# Patient Record
Sex: Male | Born: 1975
Health system: Southern US, Community
[De-identification: ages and names within clinical notes are randomized; demographics above are authoritative.]

## PROBLEM LIST (undated history)

## (undated) DIAGNOSIS — R011 Cardiac murmur, unspecified: Secondary | ICD-10-CM

## (undated) HISTORY — PX: FRACTURE SURGERY: SHX138

## (undated) HISTORY — DX: Cardiac murmur, unspecified: R01.1

---

## 2010-12-03 DIAGNOSIS — Q249 Congenital malformation of heart, unspecified: Secondary | ICD-10-CM | POA: Insufficient documentation

## 2015-12-24 DIAGNOSIS — M533 Sacrococcygeal disorders, not elsewhere classified: Secondary | ICD-10-CM | POA: Diagnosis not present

## 2016-01-22 DIAGNOSIS — J069 Acute upper respiratory infection, unspecified: Secondary | ICD-10-CM | POA: Diagnosis not present

## 2016-01-22 DIAGNOSIS — B9789 Other viral agents as the cause of diseases classified elsewhere: Secondary | ICD-10-CM | POA: Diagnosis not present

## 2016-01-22 DIAGNOSIS — R030 Elevated blood-pressure reading, without diagnosis of hypertension: Secondary | ICD-10-CM | POA: Diagnosis not present

## 2016-01-27 DIAGNOSIS — J069 Acute upper respiratory infection, unspecified: Secondary | ICD-10-CM | POA: Diagnosis not present

## 2016-01-27 DIAGNOSIS — J3089 Other allergic rhinitis: Secondary | ICD-10-CM | POA: Diagnosis not present

## 2016-01-27 DIAGNOSIS — R05 Cough: Secondary | ICD-10-CM | POA: Diagnosis not present

## 2016-02-24 DIAGNOSIS — J069 Acute upper respiratory infection, unspecified: Secondary | ICD-10-CM | POA: Diagnosis not present

## 2016-02-24 DIAGNOSIS — B9789 Other viral agents as the cause of diseases classified elsewhere: Secondary | ICD-10-CM | POA: Diagnosis not present

## 2016-03-03 DIAGNOSIS — H6693 Otitis media, unspecified, bilateral: Secondary | ICD-10-CM | POA: Diagnosis not present

## 2016-03-03 DIAGNOSIS — R05 Cough: Secondary | ICD-10-CM | POA: Diagnosis not present

## 2016-03-03 DIAGNOSIS — R0981 Nasal congestion: Secondary | ICD-10-CM | POA: Diagnosis not present

## 2016-03-24 DIAGNOSIS — Z8739 Personal history of other diseases of the musculoskeletal system and connective tissue: Secondary | ICD-10-CM | POA: Diagnosis not present

## 2016-03-24 DIAGNOSIS — M545 Low back pain: Secondary | ICD-10-CM | POA: Diagnosis not present

## 2016-04-13 DIAGNOSIS — M545 Low back pain: Secondary | ICD-10-CM | POA: Diagnosis not present

## 2016-04-13 DIAGNOSIS — G8929 Other chronic pain: Secondary | ICD-10-CM | POA: Diagnosis not present

## 2016-04-13 DIAGNOSIS — Z7689 Persons encountering health services in other specified circumstances: Secondary | ICD-10-CM | POA: Diagnosis not present

## 2016-04-20 DIAGNOSIS — H1033 Unspecified acute conjunctivitis, bilateral: Secondary | ICD-10-CM | POA: Diagnosis not present

## 2016-04-20 DIAGNOSIS — J028 Acute pharyngitis due to other specified organisms: Secondary | ICD-10-CM | POA: Diagnosis not present

## 2016-04-20 DIAGNOSIS — J029 Acute pharyngitis, unspecified: Secondary | ICD-10-CM | POA: Diagnosis not present

## 2017-03-17 DIAGNOSIS — J0100 Acute maxillary sinusitis, unspecified: Secondary | ICD-10-CM | POA: Diagnosis not present

## 2017-04-15 DIAGNOSIS — M545 Low back pain: Secondary | ICD-10-CM | POA: Diagnosis not present

## 2017-05-04 DIAGNOSIS — G8929 Other chronic pain: Secondary | ICD-10-CM | POA: Diagnosis not present

## 2017-05-04 DIAGNOSIS — M545 Low back pain: Secondary | ICD-10-CM | POA: Diagnosis not present

## 2017-05-20 DIAGNOSIS — S0990XA Unspecified injury of head, initial encounter: Secondary | ICD-10-CM | POA: Diagnosis not present

## 2017-05-20 DIAGNOSIS — S0180XA Unspecified open wound of other part of head, initial encounter: Secondary | ICD-10-CM | POA: Diagnosis not present

## 2017-05-22 DIAGNOSIS — R000 Tachycardia, unspecified: Secondary | ICD-10-CM | POA: Diagnosis not present

## 2017-05-22 DIAGNOSIS — W228XXA Striking against or struck by other objects, initial encounter: Secondary | ICD-10-CM | POA: Diagnosis not present

## 2017-05-22 DIAGNOSIS — H5789 Other specified disorders of eye and adnexa: Secondary | ICD-10-CM | POA: Diagnosis not present

## 2017-05-22 DIAGNOSIS — S0990XA Unspecified injury of head, initial encounter: Secondary | ICD-10-CM | POA: Diagnosis not present

## 2017-11-05 ENCOUNTER — Other Ambulatory Visit: Payer: Self-pay

## 2017-11-05 ENCOUNTER — Emergency Department: Payer: BLUE CROSS/BLUE SHIELD

## 2017-11-05 ENCOUNTER — Telehealth: Payer: Self-pay | Admitting: Emergency Medicine

## 2017-11-05 ENCOUNTER — Encounter: Payer: Self-pay | Admitting: Emergency Medicine

## 2017-11-05 ENCOUNTER — Emergency Department (INDEPENDENT_AMBULATORY_CARE_PROVIDER_SITE_OTHER)
Admission: EM | Admit: 2017-11-05 | Discharge: 2017-11-05 | Disposition: A | Discharge status: Home / Self Care | Payer: BLUE CROSS/BLUE SHIELD | Source: Home / Self Care | Attending: Family Medicine | Admitting: Family Medicine

## 2017-11-05 DIAGNOSIS — M79605 Pain in left leg: Secondary | ICD-10-CM | POA: Diagnosis not present

## 2017-11-05 DIAGNOSIS — I8002 Phlebitis and thrombophlebitis of superficial vessels of left lower extremity: Secondary | ICD-10-CM | POA: Diagnosis not present

## 2017-11-05 LAB — POCT CBC W AUTO DIFF (K'VILLE URGENT CARE)

## 2017-11-05 LAB — BASIC METABOLIC PANEL
BUN: 9 mg/dL (ref 7–25)
CO2: 24 mmol/L (ref 20–32)
Calcium: 9 mg/dL (ref 8.6–10.3)
Chloride: 108 mmol/L (ref 98–110)
Creat: 0.76 mg/dL (ref 0.60–1.35)
Glucose, Bld: 77 mg/dL (ref 65–99)
Potassium: 4.1 mmol/L (ref 3.5–5.3)
Sodium: 140 mmol/L (ref 135–146)

## 2017-11-05 MED ORDER — RIVAROXABAN 10 MG PO TABS: 10.0000 mg | ORAL_TABLET | Freq: Every day | ORAL | 0 refills | Status: DC

## 2017-11-05 MED ORDER — ELIQUIS 5 MG VTE STARTER PACK: ORAL_TABLET | ORAL | 0 refills | Status: DC

## 2017-11-05 MED ORDER — APIXABAN (ELIQUIS) EDUCATION KIT FOR DVT/PE PATIENTS: 1.0000 | PACK | Freq: Once | 0 refills | Status: AC

## 2017-11-05 NOTE — ED Triage Notes (Signed)
Left leg pain x 3 days, swollen at ankle, bruises on upper leg, doesn't know how they got there.

## 2017-11-05 NOTE — Discharge Instructions (Signed)
°  You were found to have a very large (from your mid calf to upper thigh) blood clot in your superficial veins.  Because of the size of the clot, you will be started on a blood thinning medication called rivaroxaban (Xarelto).  It is very important to establish care with a primary care provider/family medicine doctor as soon as possible for ongoing care and monitoring of this clot.  It is highly recommended that you start using compression socks or stockings to help with the pain and swelling, this will help the clot resolve faster. Please call 911 or go to the hospital if symptoms continue to worsen- increased pain or leg swelling, or if you develop chest pain or shortness of breath. While rare, a clot can break off and go to your heart or lungs, causing difficulty breathing and even death.

## 2017-11-05 NOTE — Telephone Encounter (Signed)
Pt returned with concern for cost of Xarelto. Will change pt to Eliquis with a 30 day Free trial kit. reiterated importance of establishing care with a PCP for ongoing care and monitoring of his condition.

## 2017-11-05 NOTE — ED Provider Notes (Signed)
[facility]    CSN: [id number] Arrival date & time: 11/05/17  1751     History   Chief Complaint Chief Complaint  Patient presents with  . Leg Pain    HPI Travis Gay is a 42 y.o. male.   HPI Travis Gay is a 42 y.o. male presenting to UC with c/o 3 days of gradually worsening Left upper medial leg mild soreness with redness and a small bruise but no known injury. He also reports swelling of his Left ankle. Hx of varicose veins since he was 42yo. He does participate in mixed martial arts and stands for prolonged periods of time but no known injury. No hx of blood clots. No recent travel or surgery. Denies chest pain or SOB.   History reviewed. No pertinent past medical history.  There are no active problems to display for this patient.   History reviewed. No pertinent surgical history.     Home Medications    Prior to Admission medications   Medication Sig Start Date End Date Taking? Authorizing Provider  rivaroxaban (XARELTO) 10 MG TABS tablet Take 1 tablet (10 mg total) by mouth daily. 11/05/17   Noe Gens, PA-C    Family History History reviewed. No pertinent family history.  Social History Social History   Tobacco Use  . Smoking status: Never Smoker  . Smokeless tobacco: Never Used  Substance Use Topics  . Alcohol use: Yes  . Drug use: Never     Allergies   Patient has no allergy information on record.   Review of Systems Review of Systems  Constitutional: Negative for chills and fever.  Respiratory: Negative for chest tightness and shortness of breath.   Cardiovascular: Positive for leg swelling. Negative for chest pain and palpitations.  Musculoskeletal: Positive for joint swelling and myalgias. Negative for arthralgias, back pain and gait problem.  Skin: Positive for color change. Negative for wound.  Neurological: Negative for weakness and numbness.     Physical Exam Triage Vital Signs ED Triage Vitals [11/05/17 0843]    Enc Vitals Group     BP 135/89     Pulse Rate 68     Resp      Temp 98.4 F (36.9 C)     Temp Source Oral     SpO2 100 %     Weight      Height      Head Circumference      Peak Flow      Pain Score      Pain Loc      Pain Edu?      Excl. in [place]?    No data found.  Updated Vital Signs BP 135/89 (BP Location: Right Arm)   Pulse 68   Temp 98.4 F (36.9 C) (Oral)   Ht 5\' 11"  (1.803 m)   Wt 200 lb (90.7 kg)   SpO2 100%   BMI 27.89 kg/m   Visual Acuity Right Eye Distance:   Left Eye Distance:   Bilateral Distance:    Right Eye Near:   Left Eye Near:    Bilateral Near:     Physical Exam  Constitutional: He is oriented to person, place, and time. He appears well-developed and well-nourished.  HENT:  Head: Normocephalic and atraumatic.  Eyes: EOM are normal.  Neck: Normal range of motion.  Cardiovascular: Normal rate.  Pulmonary/Chest: Effort normal.  Musculoskeletal: Normal range of motion. He exhibits edema and tenderness.  Bilateral leg edema, Left worse than Right.  Muscle compartments are soft. Tenderness to Left medial thigh.   Neurological: He is alert and oriented to person, place, and time.  Skin: Skin is warm and dry. There is erythema.     Psychiatric: He has a normal mood and affect. His behavior is normal.  Nursing note and vitals reviewed.    UC Treatments / Results  Labs (all labs ordered are listed, but only abnormal results are displayed) Labs Reviewed  BASIC METABOLIC PANEL  POCT CBC W AUTO DIFF ([place])    EKG None  Radiology US Venous Img Lower Unilateral Left  Result Date: 11/05/2017 CLINICAL DATA:  Upper medial thigh pain x4 days, redness and warmth EXAM: LEFT LOWER EXTREMITY VENOUS DOPPLER ULTRASOUND TECHNIQUE: Gray-scale sonography with compression, as well as color and duplex ultrasound, were performed to evaluate the deep venous system from the level of the common femoral vein through the popliteal and proximal  calf veins. COMPARISON:  None FINDINGS: Normal compressibility of the common femoral, superficial femoral, and popliteal veins, as well as the proximal calf veins. No filling defects to suggest DVT on grayscale or color Doppler imaging. Doppler waveforms show normal direction of venous flow, normal respiratory phasicity and response to augmentation. Noncompressibility of the great saphenous vein extending from mid calf across the knee to the upper thigh. Survey views of the contralateral common femoral vein are unremarkable. IMPRESSION: 1. Superficial thrombophlebitis involving left great saphenous vein. 2.  No evidence of left lower extremity deep vein thrombosis. Electronically Signed   By: Lucrezia Europe M.D.   On: 11/05/2017 09:41    Procedures Procedures (including critical care time)  Medications Ordered in UC Medications - No data to display  Initial Impression / Assessment and Plan / UC Course  I have reviewed the triage vital signs and the nursing notes.  Pertinent labs & imaging results that were available during my care of the patient were reviewed by me and considered in my medical decision making (see chart for details).      Final Clinical Impressions(s) / UC Diagnoses   Final diagnoses:  Left leg pain  Superficial thrombophlebitis of left leg     Discharge Instructions      You were found to have a very large (from your mid calf to upper thigh) blood clot in your superficial veins.  Because of the size of the clot, you will be started on a blood thinning medication called rivaroxaban (Xarelto).  It is very important to establish care with a primary care provider/family medicine doctor as soon as possible for ongoing care and monitoring of this clot.  It is highly recommended that you start using compression socks or stockings to help with the pain and swelling, this will help the clot resolve faster. Please call 911 or go to the hospital if symptoms continue to worsen-  increased pain or leg swelling, or if you develop chest pain or shortness of breath. While rare, a clot can break off and go to your heart or lungs, causing difficulty breathing and even death.     ED Prescriptions    Medication Sig Dispense Auth. Provider   rivaroxaban (XARELTO) 10 MG TABS tablet  (Status: Discontinued) Take 1 tablet (10 mg total) by mouth daily. 45 tablet Leeroy Cha O, PA-C   rivaroxaban (XARELTO) 10 MG TABS tablet Take 1 tablet (10 mg total) by mouth daily. 45 tablet Noe Gens, [place]     Controlled Substance Prescriptions [place] Controlled Substance Registry consulted? Not Applicable  Noe Gens, [place] 11/05/17 1115

## 2017-11-06 ENCOUNTER — Telehealth: Payer: Self-pay | Admitting: Emergency Medicine

## 2017-12-10 ENCOUNTER — Encounter: Payer: Self-pay | Admitting: Physician Assistant

## 2017-12-10 ENCOUNTER — Ambulatory Visit (INDEPENDENT_AMBULATORY_CARE_PROVIDER_SITE_OTHER): Payer: BLUE CROSS/BLUE SHIELD | Admitting: Physician Assistant

## 2017-12-10 VITALS — BP 129/89 | HR 74 | Resp 14 | Ht 71.0 in | Wt 202.0 lb

## 2017-12-10 DIAGNOSIS — I8393 Asymptomatic varicose veins of bilateral lower extremities: Secondary | ICD-10-CM

## 2017-12-10 DIAGNOSIS — Z008 Encounter for other general examination: Secondary | ICD-10-CM

## 2017-12-10 DIAGNOSIS — I8002 Phlebitis and thrombophlebitis of superficial vessels of left lower extremity: Secondary | ICD-10-CM | POA: Diagnosis not present

## 2017-12-10 DIAGNOSIS — Z13220 Encounter for screening for lipoid disorders: Secondary | ICD-10-CM

## 2017-12-10 DIAGNOSIS — E663 Overweight: Secondary | ICD-10-CM

## 2017-12-10 DIAGNOSIS — R609 Edema, unspecified: Secondary | ICD-10-CM | POA: Diagnosis not present

## 2017-12-10 DIAGNOSIS — Z7689 Persons encountering health services in other specified circumstances: Secondary | ICD-10-CM | POA: Diagnosis not present

## 2017-12-10 DIAGNOSIS — Z0189 Encounter for other specified special examinations: Secondary | ICD-10-CM

## 2017-12-10 DIAGNOSIS — I82511 Chronic embolism and thrombosis of right femoral vein: Secondary | ICD-10-CM | POA: Insufficient documentation

## 2017-12-10 MED ORDER — MEDICAL COMPRESSION SOCKS MISC: 0 refills | Status: DC

## 2017-12-10 MED ORDER — APIXABAN 5 MG PO TABS: 5.0000 mg | ORAL_TABLET | Freq: Two times a day (BID) | ORAL | 1 refills | Status: DC

## 2017-12-10 NOTE — Patient Instructions (Signed)
For your blood pressure: - Goal <130/80 - monitor and log blood pressures at home - check around the same time each day in a relaxed setting - Limit salt to <2000 mg/day - Follow DASH eating plan - limit alcohol to 2 standard drinks per day for men and 1 per day for women - avoid tobacco products - weight loss: 7% of current body weight - follow-up every 6 months for your blood pressure    Deep Vein Thrombosis Deep vein thrombosis (DVT) is a condition in which a blood clot forms in a deep vein, such as a lower leg, thigh, or arm vein. A clot is blood that has thickened into a gel or solid. This condition is dangerous. It can lead to serious and even life-threatening complications if the clot travels to the lungs and causes a blockage (pulmonary embolism). It can also damage veins in the leg. This can result in leg pain, swelling, discoloration, and sores (post-thrombotic syndrome). What are the causes? This condition may be caused by:  A slowdown of blood flow.  Damage to a vein.  A condition that makes blood clot more easily.  What increases the risk? The following factors may make you more likely to develop this condition:  Being overweight.  Being elderly, especially over age 33.  Sitting or lying down for more than four hours.  Lack of physical activity (sedentary lifestyle).  Being pregnant, giving birth, or having recently given birth.  Taking medicines that contain estrogen.  Smoking.  A history of any of the following: ? Blood clots or blood clotting disease. ? Peripheral vascular disease. ? Inflammatory bowel disease. ? Cancer. ? Heart disease. ? Genetic conditions that affect how blood clots. ? Neurological diseases that affect the legs (leg paresis). ? Injury. ? Major or lengthy surgery. ? A central line placed inside a large vein.  What are the signs or symptoms? Symptoms of this condition include:  Swelling, pain, or tenderness in an arm or  leg.  Warmth, redness, or discoloration in an arm or leg.  If the clot is in your leg, symptoms may be more noticeable or worse when you stand or walk. Some people do not have any symptoms. How is this diagnosed? This condition is diagnosed with:  A medical history.  A physical exam.  Tests, such as: ? Blood tests. These are done to see how your blood clots. ? Imaging tests. These are done to check for clots. Tests may include:  Ultrasound.  CT scan.  MRI.  X-ray.  Venogram. For this test, X-rays are taken after a dye is injected into a vein.  How is this treated? Treatment for this condition depends on the cause, your risk for bleeding or developing more clots, and any medical conditions you have. Treatment may include:  Taking blood thinners (also called anticoagulants). These medicines may be taken by mouth, injected under the skin, or injected through an IV tube (catheter). These medicines prevent clots from forming.  Injecting medicine that dissolves blood clots into the affected vein (catheter-directed thrombolysis).  Having surgery. Surgery may be done to: ? Remove the clot. ? Place a filter in a large vein to catch blood clots before they reach the lungs.  Some treatments may be continued for up to six months. Follow these instructions at home: If you are taking an oral blood thinner:  Take the medicine exactly as told by your health care provider. Some blood thinners need to be taken at the same time  every day. Do not skip a dose.  Ask your health care provider about what foods and drugs interact with the medicine.  Ask about possible side effects. General instructions  Blood thinners can cause easy bruising and difficulty stopping bleeding. Because of this, if you are taking or were given a blood thinner: ? Hold pressure over cuts for longer than usual. ? Tell your dentist and other health care providers that you are taking blood thinners before having any  procedures that can cause bleeding. ? Avoid contact sports.  Take over-the-counter and prescription medicines only as told by your health care provider.  Return to your normal activities as told by your health care provider. Ask your health care provider what activities are safe for you.  Wear compression stockings if recommended by your health care provider.  Keep all follow-up visits as told by your health care provider. This is important. How is this prevented? To lower your risk of developing this condition again:  For 30 or more minutes every day, do an activity that: ? Involves moving your arms and legs. ? Increases your heart rate.  When traveling for longer than four hours: ? Exercise your arms and legs every hour. ? Drink plenty of water. ? Avoid drinking alcohol.  Avoid sitting or lying for a long time without moving your legs.  Stay a healthy weight.  If you are a woman who is older than age 1, avoid unnecessary use of medicines that contain estrogen.  Do not use any products that contain nicotine or tobacco, such as cigarettes and e-cigarettes. This is especially important if you take estrogen medicines. If you need help quitting, ask your health care provider.  Contact a health care provider if:  You miss a dose of your blood thinner.  You have nausea, vomiting, or diarrhea that lasts for more than one day.  Your menstrual period is heavier than usual.  You have unusual bruising. Get help right away if:  You have new or increased pain, swelling, or redness in an arm or leg.  You have numbness or tingling in an arm or leg.  You have shortness of breath.  You have chest pain.  You have a rapid or irregular heartbeat.  You feel light-headed or dizzy.  You cough up blood.  There is blood in your vomit, stool, or urine.  You have a serious fall or accident, or you hit your head.  You have a severe headache or confusion.  You have a cut that will  not stop bleeding. These symptoms may represent a serious problem that is an emergency. Do not wait to see if the symptoms will go away. Get medical help right away. Call your local emergency services (911 in the [place] Do not drive yourself to the hospital. Summary  DVT is a condition in which a blood clot forms in a deep vein, such as a lower leg, thigh, or arm vein.  Symptoms can include swelling, warmth, pain, and redness in your leg or arm.  Treatment may include taking blood thinners, injecting medicine that dissolves blood clots,wearing compression stockings, or surgery.  If you are prescribed blood thinners, take them exactly as told. This information is not intended to replace advice given to you by your health care provider. Make sure you discuss any questions you have with your health care provider. Document Released: 02/02/2005 Document Revised: 03/07/2016 Document Reviewed: 03/07/2016 Elsevier Interactive Patient Education  2018 [employer].

## 2017-12-10 NOTE — Progress Notes (Signed)
HPI:                                                                Travis Gay is a 42 y.o. male who presents to [facility] today to establish care  Current concerns: requesting biometric exam for employer  Recently diagnosed on 11-05-17 with a superficial thrombophlebitis of the left lower extremity. Since there was involvement of the saphenofemoral junction, he was started on NOAC. He has been taking Eliquis since that time.  Appears this clot was unprovoked.  He denied any known injury or trauma, any prolonged immobilization, prior history of blood clots, or smoking. He reports his mother may have a history of blood clots but he is not certain. He endorses chronic bilateral lower extremity swelling.  He states he has not had any pain in his left leg for at least several weeks.  He is not currently wearing compression socks.   Depression screen PHQ 2/9 12/10/2017  Decreased Interest 0  Down, Depressed, Hopeless 0  PHQ - 2 Score 0    No flowsheet data found.    Past Medical History:  Diagnosis Date  . Heart murmur    Past Surgical History:  Procedure Laterality Date  . FRACTURE SURGERY Left    Social History   Tobacco Use  . Smoking status: Never Smoker  . Smokeless tobacco: Never Used  Substance Use Topics  . Alcohol use: Yes    Alcohol/week: 2.0 standard drinks    Types: 2 Standard drinks or equivalent per week   family history includes Cancer in his father; Deep vein thrombosis in his mother; Heart disease in his mother; Stroke in his mother.    ROS: negative except as noted in the HPI  Medications: Current Outpatient Medications  Medication Sig Dispense Refill  . apixaban (ELIQUIS) 5 MG TABS tablet Take 1 tablet (5 mg total) by mouth 2 (two) times daily. 60 tablet 1  . Elastic Bandages & Supports (MEDICAL COMPRESSION SOCKS) MISC Knee high, medium compression socks. Apply to both lower extremities daily. Remove  at bedtime 2 each 0   No current facility-administered medications for this visit.    No Known Allergies     Objective:  BP 129/89   Pulse 74   Resp 14   Ht 5\' 11"  (1.803 m)   Wt 202 lb (91.6 kg)   BMI 28.17 kg/m  Gen:  alert, not ill-appearing, no distress, appropriate for age 84: head normocephalic without obvious abnormality, conjunctiva and cornea clear, trachea midline Pulm: Normal work of breathing, normal phonation, clear to auscultation bilaterally, no wheezes, rales or rhonchi CV: Normal rate, regular rhythm, s1 and s2 distinct, no murmurs, clicks or rubs  Neuro: alert and oriented x 3, no tremor MSK: extremities atraumatic, calves nontender, there is 2+ peripheral edema of the LLE, and trace peripheral edem aof the RLE, normal gait and station Skin: intact, nontender varicosities of bilateral distal lower extremities Psych: well-groomed, cooperative, good eye contact, euthymic mood, affect mood-congruent, speech is articulate, and thought processes clear and goal-directed  CLINICAL DATA:  Upper medial thigh pain x4 days, redness and warmth  EXAM: LEFT LOWER EXTREMITY VENOUS DOPPLER ULTRASOUND  TECHNIQUE: Gray-scale sonography with compression, as well as color and duplex  ultrasound, were performed to evaluate the deep venous system from the level of the common femoral vein through the popliteal and proximal calf veins.  COMPARISON:  None  FINDINGS: Normal compressibility of the common femoral, superficial femoral, and popliteal veins, as well as the proximal calf veins. No filling defects to suggest DVT on grayscale or color Doppler imaging. Doppler waveforms show normal direction of venous flow, normal respiratory phasicity and response to augmentation. Noncompressibility of the great saphenous vein extending from mid calf across the knee to the upper thigh. Survey views of the contralateral common femoral vein are unremarkable.  IMPRESSION: 1.  Superficial thrombophlebitis involving left great saphenous vein. 2.  No evidence of left lower extremity deep vein thrombosis.   Electronically Signed   By: Lucrezia Europe M.D.   On: 11/05/2017 09:41  Lab Results  Component Value Date   CREATININE 0.76 11/05/2017   BUN 9 11/05/2017   NA 140 11/05/2017   K 4.1 11/05/2017   CL 108 11/05/2017   CO2 24 11/05/2017     Assessment and Plan: 42 y.o. male with   .Travis Gay was seen today for establish care.  Diagnoses and all orders for this visit:  Encounter to establish care  Thrombophlebitis of superficial veins of left lower extremity -     apixaban (ELIQUIS) 5 MG TABS tablet; Take 1 tablet (5 mg total) by mouth 2 (two) times daily. -     Elastic Bandages & Supports (MEDICAL COMPRESSION SOCKS) MISC; Knee high, medium compression socks. Apply to both lower extremities daily. Remove at bedtime  Peripheral edema -     Elastic Bandages & Supports (MEDICAL COMPRESSION SOCKS) MISC; Knee high, medium compression socks. Apply to both lower extremities daily. Remove at bedtime  Asymptomatic varicose veins of both lower extremities  Overweight (BMI 25.0-29.9)  Encounter for biometric screening -     Lipid Panel w/reflex Direct LDL  Screening for lipid disorders -     Lipid Panel w/reflex Direct LDL   - Personally reviewed PMH, PSH, PFH, medications, allergies, HM - Personally reviewed labs results from 10/2017 and [place] results - Age-appropriate cancer screening: n/a - Influenza declined - Tdap UTD - PHQ2 negative    Single unprovoked thrombophlebitis without true DVT. Recommend full 3 months of anticoagulation, so Eliquis was refilled for 2 more months today. Normal renal function. Compression socks also ordered to be worn daily.   Patient education and anticipatory guidance given Patient agrees with treatment plan Follow-up as needed if symptoms worsen or fail to improve  Darlyne Russian PA-C

## 2017-12-22 DIAGNOSIS — Z13220 Encounter for screening for lipoid disorders: Secondary | ICD-10-CM | POA: Diagnosis not present

## 2017-12-22 DIAGNOSIS — Z0189 Encounter for other specified special examinations: Secondary | ICD-10-CM | POA: Diagnosis not present

## 2017-12-23 LAB — LIPID PANEL W/REFLEX DIRECT LDL
Cholesterol: 169 mg/dL (ref <200)
HDL: 57 mg/dL (ref >40)
LDL CHOLESTEROL (CALC): 94 mg/dL
NON-HDL CHOLESTEROL (CALC): 112 mg/dL (ref <130)
Total CHOL/HDL Ratio: 3 (calc) (ref <5.0)
Triglycerides: 85 mg/dL (ref <150)

## 2018-02-28 ENCOUNTER — Other Ambulatory Visit: Payer: Self-pay | Admitting: Physician Assistant

## 2018-02-28 DIAGNOSIS — I8002 Phlebitis and thrombophlebitis of superficial vessels of left lower extremity: Secondary | ICD-10-CM

## 2018-07-20 ENCOUNTER — Ambulatory Visit (INDEPENDENT_AMBULATORY_CARE_PROVIDER_SITE_OTHER): Payer: BC Managed Care – PPO | Admitting: Physician Assistant

## 2018-07-20 ENCOUNTER — Encounter: Payer: Self-pay | Admitting: Physician Assistant

## 2018-07-20 VITALS — BP 131/78 | HR 89 | Temp 97.8°F | Ht 71.0 in | Wt 216.0 lb

## 2018-07-20 DIAGNOSIS — I8002 Phlebitis and thrombophlebitis of superficial vessels of left lower extremity: Secondary | ICD-10-CM

## 2018-07-20 DIAGNOSIS — R609 Edema, unspecified: Secondary | ICD-10-CM

## 2018-07-20 MED ORDER — IBUPROFEN 800 MG PO TABS: 800.0000 mg | ORAL_TABLET | Freq: Three times a day (TID) | ORAL | 0 refills | Status: DC | PRN

## 2018-07-20 NOTE — Patient Instructions (Signed)
Thrombophlebitis  Thrombophlebitis is a condition in which a blood clot forms in a vein. This can happen in your arms or legs, or in the area between your neck and groin (torso). When this condition happens in a vein that is close to the surface of the body (superficial thrombophlebitis), it is usually not serious.However, when the condition happens in a vein that is deep inside the body (deep vein thrombosis, DVT), it can cause serious problems.  What are the causes?  This condition may be caused by:   Damage to a vein.   Inflammation of the veins.   A condition that causes blood to clot more easily.   Reduced blood flow through the veins.  What increases the risk?  The following factors may make you more likely to develop this condition:   Having a condition that makes blood thicker or more likely to clot.   Having an infection.   Having major surgery.   Experiencing a traumatic injury or a broken bone.   Having a catheter in a vein (central line).   Having a condition in which valves in the veins do not work properly, causing blood to collect (pool) in the veins (chronic venous insufficiency).   An inactive (sedentary) lifestyle.   Pregnancy or having recently given birth.   Cancer.   Older age, especially being 60 or older.   Obesity.   Smoking.   Taking medicines that contain estrogen, such as birth control pills.   Having varicose veins.   Using drugs that are injected into the veins (intravenous, IV).  What are the signs or symptoms?  The main symptoms of this condition are:   Swelling and pain in an arm or leg. If the affected vein is in the leg, you may feel pain while standing or walking.   Warmth or redness in an arm or leg.  Other symptoms include:   Low-grade fever.   Muscle aches.   A bulging vein (venous distension).  In some cases, there are no symptoms.  How is this diagnosed?  This condition may be diagnosed based on:   Your symptoms and medical history.   A physical  exam.   Tests, such as:  ? Blood tests.  ? A test that uses sound waves to make images (ultrasound).  How is this treated?  Treatment depends on how severe the condition is and which area of the body is affected. Treatment may include:   Applying a warm compress or heating pad to affected areas.   Wearing compression stockings to help prevent blood clots and reduce swelling in your legs.   Raising (elevating) the affected arm or leg above the level of your heart.   Medicines, such as:  ? Anti-inflammatory medicines, such as ibuprofen.  ? Blood thinners (anticoagulants), such as heparin.  ? Antibiotic medicine, if you have an infection.   Removing an IV that may be causing the problem.  In rare cases, surgery may be needed to:   Remove a damaged section of a vein.   Place a filter in a large vein to catch blood clots before they reach the lungs.  Follow these instructions at home:  Medicines   Take over-the-counter and prescription medicines only as told by your health care provider.   If you were prescribed an antibiotic, take it as told by your health care provider. Do not stop using the antibiotic even if you feel better.  Managing pain, stiffness, and swelling       If directed, put heat on the affected area as often as told by your health care provider. Use the heat source that your health care provider recommends, such as a moist heat pack or a heating pad.  ? Place a towel between your skin and the heat source.  ? Leave the heat on for 20-30 minutes.  ? Remove the heat if your skin turns bright red. This is especially important if you are not able to feel pain, heat, or cold. You may have a greater risk of getting burned.   Elevate the affected area above the level of your heart while you are sitting or lying down.  Activity   Return to your normal activities as told by your health care provider. Ask your health care provider what activities are safe for you.   Avoid sitting or lying down for long  periods. If possible, stand up and walk around regularly.  If you are taking blood thinners:   Take your medicine exactly as told, at the same time every day.   Avoid activities that could cause injury or bruising, and follow instructions about how to prevent falls.   Wear a medical alert bracelet or carry a card that lists what medicines you take.  General instructions   Drink enough fluid to keep your urine pale yellow.   Wear compression stockings as told by your health care provider.   Do not use any products that contain nicotine or tobacco, such as cigarettes and e-cigarettes. If you need help quitting, ask your health care provider.   Keep all follow-up visits as told by your health care provider. This is important.  Contact a health care provider if:   You miss a dose of your blood thinner, if applicable.   Your symptoms do not improve.   You have unusual bruising.   You have nausea, vomiting, or diarrhea that lasts for more than one day.  Get help right away if:   You have any of these problems:  ? New or worse pain, swelling, or redness in an arm or leg.  ? Numbness or tingling in an arm or leg.  ? Shortness of breath.  ? Chest pain.  ? Severe pain in your abdomen.  ? Fast breathing.  ? A fast or irregular heartbeat.  ? Blood in your vomit, stool, or urine.  ? A severe headache or confusion.  ? A cut that does not stop bleeding.   You feel light-headed or dizzy.   You cough up blood.   You have a serious fall or accident, or you hit your head.  These symptoms may represent a serious problem that is an emergency. Do not wait to see if the symptoms will go away. Get medical help right away. Call your local emergency services (911 in the [place] Do not drive yourself to the hospital.  Summary   Thrombophlebitis is a condition in which a blood clot forms in a vein. This can happen in a vein close to the surface of the body or a vein deep inside the body.   This condition can cause serious  problems when it happens in a vein deep inside the body (deep vein thrombosis, DVT).   The main symptom of this condition is swelling and pain around the affected vein.   Treatment may include warm compresses, anti-inflammatory medicines, or blood thinners.  This information is not intended to replace advice given to you by your health care provider. Make sure you discuss   any questions you have with your health care provider.    Document Released: 07/29/2016 Document Revised: 07/29/2016 Document Reviewed: 07/29/2016  Elsevier Interactive Patient Education  2019 [employer]

## 2018-07-20 NOTE — Progress Notes (Signed)
   Subjective:    Patient ID: Travis Gay, male    DOB: December 27, 1975, 43 y.o.   MRN: [id number]  HPI  Pt is a 43 yo male with hx of superficial thrombophlebitis of left upper thigh in September 2019. He went to urgent care and was put on eliquis. He stayed on eliquis for 6 months. He stopped early febuary. He has had no problems since until a few days ago the pain/ache in his upper left leg started to come back. He denies any injury to this are. He does have prominent veins. He is on his feet a lot and even more lately. He denies any fever, chills, SOB. Left upper thigh does not feel warm but hit is tender to touch. Not tried anything to make better. Not wearing compression stockings. Never tried them. No recent surgeries or travel. No new medications.   .. Active Ambulatory Problems    Diagnosis Date Noted  . Congenital heart defect 12/03/2010  . Thrombophlebitis of superficial veins of left lower extremity 12/10/2017  . Peripheral edema 12/10/2017  . Asymptomatic varicose veins of both lower extremities 12/10/2017  . Overweight (BMI 25.0-29.9) 12/10/2017   Resolved Ambulatory Problems    Diagnosis Date Noted  . No Resolved Ambulatory Problems   Past Medical History:  Diagnosis Date  . Heart murmur     Review of Systems See HPI>     Objective:   Physical Exam Vitals signs reviewed.  Constitutional:      Appearance: Normal appearance.  HENT:     Head: Normocephalic.  Cardiovascular:     Rate and Rhythm: Normal rate and regular rhythm.     Pulses: Normal pulses.  Pulmonary:     Effort: Pulmonary effort is normal.     Breath sounds: Normal breath sounds.  Musculoskeletal:     Comments: 1 plus pitting edema left leg around ankle.  Tenderness and firmness of left great saphenous vein about 2-3cm of firmness. No warmth or redness noted.  Prominent vein. Negative homans sign.   Neurological:     General: No focal deficit present.     Mental Status: He is alert and oriented  to person, place, and time.  Psychiatric:        Mood and Affect: Mood normal.           Assessment & Plan:  Marland KitchenMarland KitchenJulis was seen today for leg pain.  Diagnoses and all orders for this visit:  Thrombophlebitis of superficial veins of left lower extremity -     ibuprofen (ADVIL) 800 MG tablet; Take 1 tablet (800 mg total) by mouth every 8 (eight) hours as needed.  Peripheral edema  reviewed venous doppler from 10/2017.   Area of palpable firmness and tenderness over great saphenous vein. Suspect flare of SVT of left leg. Discussed starting with conservative treatment with ibuprofen 800mg  TID for 7-14 days WITH warm compresses and compression stockings and frequent elevation. Discussed pathology of this occurring. Reassured no signs of infection. Risk of DVT is low and with SVT hx. If he were to have worsening pain and swelling. Follow up for stat venous doppler. HO given. Follow up as needed.   Reviewed plan with supervising physician Dr. Beatrice Lecher who agreed with plan.   [name].Spent 30 minutes with patient and greater than 50 percent of visit spent counseling patient regarding treatment plan.

## 2018-07-20 NOTE — Progress Notes (Signed)
89 97.8 Oral

## 2018-07-22 ENCOUNTER — Encounter: Payer: Self-pay | Admitting: Physician Assistant

## 2018-12-02 ENCOUNTER — Ambulatory Visit (INDEPENDENT_AMBULATORY_CARE_PROVIDER_SITE_OTHER): Payer: BC Managed Care – PPO | Admitting: Physician Assistant

## 2018-12-02 ENCOUNTER — Other Ambulatory Visit: Payer: Self-pay

## 2018-12-02 ENCOUNTER — Encounter: Payer: Self-pay | Admitting: Physician Assistant

## 2018-12-02 VITALS — BP 124/90 | HR 84 | Ht 71.0 in | Wt 220.0 lb

## 2018-12-02 DIAGNOSIS — Z131 Encounter for screening for diabetes mellitus: Secondary | ICD-10-CM

## 2018-12-02 DIAGNOSIS — I878 Other specified disorders of veins: Secondary | ICD-10-CM

## 2018-12-02 DIAGNOSIS — Z0000 Encounter for general adult medical examination without abnormal findings: Secondary | ICD-10-CM

## 2018-12-02 DIAGNOSIS — Z6830 Body mass index (BMI) 30.0-30.9, adult: Secondary | ICD-10-CM

## 2018-12-02 DIAGNOSIS — Z13220 Encounter for screening for lipoid disorders: Secondary | ICD-10-CM | POA: Diagnosis not present

## 2018-12-02 DIAGNOSIS — E6609 Other obesity due to excess calories: Secondary | ICD-10-CM

## 2018-12-02 DIAGNOSIS — R600 Localized edema: Secondary | ICD-10-CM | POA: Insufficient documentation

## 2018-12-02 NOTE — Progress Notes (Signed)
Subjective:    Patient ID: Travis Gay, male    DOB: Aug 04, 1975, 43 y.o.   MRN: [id number]  HPI  Pt is a 43 yo male who presents to the clinic for annual wellness exam.   He has no concerns or complaints today.   He has some intermittent swelling of legs after working all day and hx of thrombophlebitis. No SOB, orthopnea, CP.   .. Active Ambulatory Problems    Diagnosis Date Noted  . Congenital heart defect 12/03/2010  . Thrombophlebitis of superficial veins of left lower extremity 12/10/2017  . Peripheral edema 12/10/2017  . Asymptomatic varicose veins of both lower extremities 12/10/2017  . Overweight (BMI 25.0-29.9) 12/10/2017  . Class 1 obesity due to excess calories without serious comorbidity with body mass index (BMI) of 30.0 to 30.9 in adult 12/02/2018   Resolved Ambulatory Problems    Diagnosis Date Noted  . No Resolved Ambulatory Problems   Past Medical History:  Diagnosis Date  . Heart murmur    .[name] Family History  Problem Relation Age of Onset  . Deep vein thrombosis Mother   . Heart disease Mother   . Stroke Mother   . Cancer Father    .[name] Social History   Socioeconomic History  . Marital status: Married    Spouse name: Not on file  . Number of children: Not on file  . Years of education: Not on file  . Highest education level: Not on file  Occupational History  . Not on file  Social Needs  . Financial resource strain: Not on file  . Food insecurity    Worry: Not on file    Inability: Not on file  . Transportation needs    Medical: Not on file    Non-medical: Not on file  Tobacco Use  . Smoking status: Never Smoker  . Smokeless tobacco: Never Used  Substance and Sexual Activity  . Alcohol use: Yes    Alcohol/week: 2.0 standard drinks    Types: 2 Standard drinks or equivalent per week  . Drug use: Never  . Sexual activity: Yes    Birth control/protection: Condom  Lifestyle  . Physical activity    Days per week: Not on file    Minutes  per session: Not on file  . Stress: Not on file  Relationships  . Social Herbalist on phone: Not on file    Gets together: Not on file    Attends religious service: Not on file    Active member of club or organization: Not on file    Attends meetings of clubs or organizations: Not on file    Relationship status: Not on file  . Intimate partner violence    Fear of current or ex partner: Not on file    Emotionally abused: Not on file    Physically abused: Not on file    Forced sexual activity: Not on file  Other Topics Concern  . Not on file  Social History Narrative  . Not on file      Review of Systems  Constitutional: Negative.   HENT: Negative.   Eyes: Negative.   Respiratory: Negative.   Cardiovascular: Negative.   Gastrointestinal: Negative.   Endocrine: Negative.   Genitourinary: Negative.   Musculoskeletal: Negative.   Neurological: Negative.   Hematological: Negative.   Psychiatric/Behavioral: Negative.   All other systems reviewed and are negative.      Objective:   Physical Exam BP 124/90  Pulse 84   Ht 5\' 11"  (1.803 m)   Wt 220 lb (99.8 kg)   SpO2 97%   BMI 30.68 kg/m   General Appearance:    Alert, cooperative, no distress, appears stated age  Head:    Normocephalic, without obvious abnormality, atraumatic  Eyes:    PERRL, conjunctiva/corneas clear, EOM's intact, fundi    benign, both eyes       Ears:    Normal TM's and external ear canals, both ears  Nose:   Nares normal, septum midline, mucosa normal, no drainage    or sinus tenderness  Throat:   Lips, mucosa, and tongue normal; teeth and gums normal  Neck:   Supple, symmetrical, trachea midline, no adenopathy;       thyroid:  No enlargement/tenderness/nodules; no carotid   bruit or JVD  Back:     Symmetric, no curvature, ROM normal, no CVA tenderness  Lungs:     Clear to auscultation bilaterally, respirations unlabored  Chest wall:    No tenderness or deformity  Heart:     Regular rate and rhythm, S1 and S2 normal, 2/6 SE murmur, rub   or gallop  Abdomen:     Soft, non-tender, bowel sounds active all four quadrants,    no masses, no organomegaly        Extremities:   Extremities normal, atraumatic, no cyanosis. 1+ pitting edema/varicose veins present bilateral leg.   Pulses:   2+ and symmetric all extremities  Skin:   Skin color, texture, turgor normal, no rashes or lesions  Lymph nodes:   Cervical, supraclavicular, and axillary nodes normal  Neurologic:   CNII-XII intact. Normal strength, sensation and reflexes      throughout     .[name] Depression screen [facility] 2/9 12/02/2018 12/10/2017  Decreased Interest 0 0  Down, Depressed, Hopeless 0 0  PHQ - 2 Score 0 0  Altered sleeping 0 -  Tired, decreased energy 0 -  Change in appetite 0 -  Feeling bad or failure about yourself  0 -  Trouble concentrating 0 -  Moving slowly or fidgety/restless 0 -  Suicidal thoughts 0 -  PHQ-9 Score 0 -  Difficult doing work/chores Not difficult at all -       Assessment & Plan:  Marland KitchenMarland KitchenWadsworth was seen today for annual exam.  Diagnoses and all orders for this visit:  Routine physical examination  Screening for diabetes mellitus -     COMPLETE METABOLIC PANEL WITH GFR  Screening for lipid disorders -     Lipid Panel w/reflex Direct LDL  Class 1 obesity due to excess calories without serious comorbidity with body mass index (BMI) of 30.0 to 30.9 in adult  Chronic venous stasis   .Marland KitchenStart a regular exercise program and make sure you are eating a healthy diet Try to eat 4 servings of dairy a day or take a calcium supplement (500mg  twice a day). Pt declined flu shot. Fasting labs ordered.   [name].Discussed low carb diet with 1500 calories and 80g of protein.  Exercising at least 150 minutes a week.  [employer] could be a Microbiologist.   Some minmal swelling during the day encouraged compression stockings. Sounds like venous stasis. Pt denies any SOb. Suggested echo  to reevaluate the heart function. Pt would like to hold off for now.

## 2018-12-02 NOTE — Patient Instructions (Signed)

## 2018-12-03 LAB — COMPLETE METABOLIC PANEL WITH GFR
AG Ratio: 1.7 (calc) (ref 1.0–2.5)
ALT: 32 U/L (ref 9–46)
AST: 43 U/L — ABNORMAL HIGH (ref 10–40)
Albumin: 3.9 g/dL (ref 3.6–5.1)
Alkaline phosphatase (APISO): 87 U/L (ref 36–130)
BUN: 11 mg/dL (ref 7–25)
CO2: 23 mmol/L (ref 20–32)
Calcium: 8.9 mg/dL (ref 8.6–10.3)
Chloride: 107 mmol/L (ref 98–110)
Creat: 1.01 mg/dL (ref 0.60–1.35)
GFR, Est African American: 105 mL/min/{1.73_m2} (ref >=60)
GFR, Est Non African American: 91 mL/min/{1.73_m2} (ref >=60)
Globulin: 2.3 g/dL (calc) (ref 1.9–3.7)
Glucose, Bld: 83 mg/dL (ref 65–99)
Potassium: 4.1 mmol/L (ref 3.5–5.3)
Sodium: 142 mmol/L (ref 135–146)
Total Bilirubin: 1.9 mg/dL — ABNORMAL HIGH (ref 0.2–1.2)
Total Protein: 6.2 g/dL (ref 6.1–8.1)

## 2018-12-03 LAB — LIPID PANEL W/REFLEX DIRECT LDL
Cholesterol: 184 mg/dL (ref <200)
HDL: 53 mg/dL (ref >=40)
LDL Cholesterol (Calc): 113 mg/dL (calc) — ABNORMAL HIGH
Non-HDL Cholesterol (Calc): 131 mg/dL (calc) — ABNORMAL HIGH (ref <130)
Total CHOL/HDL Ratio: 3.5 (calc) (ref <5.0)
Triglycerides: 82 mg/dL (ref <150)

## 2018-12-06 ENCOUNTER — Encounter: Payer: Self-pay | Admitting: Physician Assistant

## 2018-12-06 ENCOUNTER — Other Ambulatory Visit: Payer: Self-pay

## 2018-12-06 ENCOUNTER — Other Ambulatory Visit: Payer: Self-pay | Admitting: Physician Assistant

## 2018-12-06 DIAGNOSIS — R17 Unspecified jaundice: Secondary | ICD-10-CM | POA: Insufficient documentation

## 2018-12-06 DIAGNOSIS — R748 Abnormal levels of other serum enzymes: Secondary | ICD-10-CM | POA: Insufficient documentation

## 2018-12-06 NOTE — Progress Notes (Signed)
Call pt:  He had a work form too I believe.   Cholesterol looks good. Up a little from last year. Watch processed and fried fatty foods.  Kidney and sugar look good.  Your bilirubin is up a little bit along with one of your liver enzymes. Lets recheck in 2 weeks and see if trending up. Avoid tylenol and alcohol for the next 2 weeks.

## 2019-07-10 ENCOUNTER — Encounter: Payer: Self-pay | Admitting: Family Medicine

## 2019-07-10 ENCOUNTER — Other Ambulatory Visit: Payer: Self-pay

## 2019-07-10 ENCOUNTER — Ambulatory Visit (INDEPENDENT_AMBULATORY_CARE_PROVIDER_SITE_OTHER): Payer: BC Managed Care – PPO | Admitting: Family Medicine

## 2019-07-10 VITALS — BP 127/71 | HR 87 | Temp 97.9°F | Ht 70.87 in | Wt 204.6 lb

## 2019-07-10 DIAGNOSIS — R202 Paresthesia of skin: Secondary | ICD-10-CM | POA: Insufficient documentation

## 2019-07-10 LAB — COMPLETE METABOLIC PANEL WITH GFR
AG Ratio: 1.8 (calc) (ref 1.0–2.5)
ALT: 37 U/L (ref 9–46)
AST: 38 U/L (ref 10–40)
Albumin: 3.7 g/dL (ref 3.6–5.1)
Alkaline phosphatase (APISO): 77 U/L (ref 36–130)
BUN: 8 mg/dL (ref 7–25)
CO2: 25 mmol/L (ref 20–32)
Calcium: 9.1 mg/dL (ref 8.6–10.3)
Chloride: 109 mmol/L (ref 98–110)
Creat: 0.9 mg/dL (ref 0.60–1.35)
GFR, Est African American: 121 mL/min/{1.73_m2} (ref >=60)
GFR, Est Non African American: 104 mL/min/{1.73_m2} (ref >=60)
Globulin: 2.1 g/dL (calc) (ref 1.9–3.7)
Glucose, Bld: 91 mg/dL (ref 65–99)
Potassium: 4 mmol/L (ref 3.5–5.3)
Sodium: 141 mmol/L (ref 135–146)
Total Bilirubin: 1.4 mg/dL — ABNORMAL HIGH (ref 0.2–1.2)
Total Protein: 5.8 g/dL — ABNORMAL LOW (ref 6.1–8.1)

## 2019-07-10 LAB — CBC
HCT: 47.9 % (ref 38.5–50.0)
Hemoglobin: 16.7 g/dL (ref 13.2–17.1)
MCH: 34.4 pg — ABNORMAL HIGH (ref 27.0–33.0)
MCHC: 34.9 g/dL (ref 32.0–36.0)
MCV: 98.8 fL (ref 80.0–100.0)
MPV: 11.4 fL (ref 7.5–12.5)
Platelets: 160 10*3/uL (ref 140–400)
RBC: 4.85 10*6/uL (ref 4.20–5.80)
RDW: 13 % (ref 11.0–15.0)
WBC: 5 10*3/uL (ref 3.8–10.8)

## 2019-07-10 LAB — VITAMIN B12: Vitamin B-12: 554 pg/mL (ref 200–1100)

## 2019-07-10 NOTE — Progress Notes (Signed)
Travis Gay - 44 y.o. male MRN [id number]  Date of birth: 07-12-1975  Subjective Chief Complaint  Patient presents with  . Numbness    HPI Travis Gay is a 44 y.o. male here today with complaint of paresthesias in extremities.  Report that this is worse if sitting for prolonged periods of time.  Bilateral upper and lower extremities are affected.  Has not really noticed if standing or moving around.  Has had some occasional back pain.  He denies neck pain, headache,  fever, chills, weakness.   ROS:  A comprehensive ROS was completed and negative except as noted per HPI     No Known Allergies  Past Medical History:  Diagnosis Date  . Heart murmur     Past Surgical History:  Procedure Laterality Date  . FRACTURE SURGERY Left     Social History   Socioeconomic History  . Marital status: Married    Spouse name: Not on file  . Number of children: Not on file  . Years of education: Not on file  . Highest education level: Not on file  Occupational History  . Not on file  Tobacco Use  . Smoking status: Never Smoker  . Smokeless tobacco: Never Used  Substance and Sexual Activity  . Alcohol use: Yes    Alcohol/week: 2.0 standard drinks    Types: 2 Standard drinks or equivalent per week  . Drug use: Never  . Sexual activity: Yes    Birth control/protection: Condom  Other Topics Concern  . Not on file  Social History Narrative  . Not on file   Social Determinants of Health   Financial Resource Strain:   . Difficulty of Paying Living Expenses:   Food Insecurity:   . Worried About Charity fundraiser in the Last Year:   . Arboriculturist in the Last Year:   Transportation Needs:   . Film/video editor (Medical):   [name] Lack of Transportation (Non-Medical):   Physical Activity:   . Days of Exercise per Week:   . Minutes of Exercise per Session:   Stress:   . Feeling of Stress :   Social Connections:   . Frequency of Communication with Friends and Family:    . Frequency of Social Gatherings with Friends and Family:   . Attends Religious Services:   . Active Member of Clubs or Organizations:   . Attends Archivist Meetings:   [name] Marital Status:     Family History  Problem Relation Age of Onset  . Deep vein thrombosis Mother   . Heart disease Mother   . Stroke Mother   . Cancer Father     Health Maintenance  Topic Date Due  . HIV Screening  Never done  . COVID-19 Vaccine (1) 07/26/2019 (Originally 07/13/1987)  . TETANUS/TDAP  05/21/2027  . INFLUENZA VACCINE  Discontinued     ----------------------------------------------------------------------------------------------------------------------------------------------------------------------------------------------------------------- Physical Exam BP 127/71 (BP Location: Left Arm, Patient Position: Sitting, Cuff Size: Normal)   Pulse 87   Temp 97.9 F (36.6 C) (Temporal)   Ht 5' 10.87" (1.8 m)   Wt 204 lb 9.6 oz (92.8 kg)   SpO2 98%   BMI 28.64 kg/m   Physical Exam Constitutional:      Appearance: Normal appearance.  Eyes:     General: No scleral icterus. Cardiovascular:     Rate and Rhythm: Normal rate and regular rhythm.  Pulmonary:     Effort: Pulmonary effort is normal.  Breath sounds: Normal breath sounds.  Musculoskeletal:     Cervical back: Neck supple.  Skin:    General: Skin is warm and dry.  Neurological:     General: No focal deficit present.     Mental Status: He is alert and oriented to person, place, and time.     Cranial Nerves: No cranial nerve deficit.     Motor: No weakness.     Gait: Gait normal.     Deep Tendon Reflexes: Reflexes normal.  Psychiatric:        Mood and Affect: Mood normal.        Behavior: Behavior normal.      ------------------------------------------------------------------------------------------------------------------------------------------------------------------------------------------------------------------- Assessment and Plan  Paresthesias Orders Placed This Encounter  Procedures  . COMPLETE METABOLIC PANEL WITH GFR  . B12  . CBC  . NCV with EMG(electromyography)    Standing Status:   Future    Standing Expiration Date:   07/09/2020  EMG/NCV ordered.   No orders of the defined types were placed in this encounter.   No follow-ups on file.    This visit occurred during the SARS-CoV-2 public health emergency.  Safety protocols were in place, including screening questions prior to the visit, additional usage of staff PPE, and extensive cleaning of exam room while observing appropriate contact time as indicated for disinfecting solutions.

## 2019-07-10 NOTE — Assessment & Plan Note (Signed)
Orders Placed This Encounter  Procedures  . COMPLETE METABOLIC PANEL WITH GFR  . B12  . CBC  . NCV with EMG(electromyography)    Standing Status:   Future    Standing Expiration Date:   07/09/2020  EMG/NCV ordered.

## 2019-12-18 ENCOUNTER — Encounter: Payer: Self-pay | Admitting: Emergency Medicine

## 2019-12-18 ENCOUNTER — Emergency Department (INDEPENDENT_AMBULATORY_CARE_PROVIDER_SITE_OTHER)
Admission: EM | Admit: 2019-12-18 | Discharge: 2019-12-18 | Disposition: A | Discharge status: Home / Self Care | Payer: BC Managed Care – PPO | Source: Home / Self Care

## 2019-12-18 ENCOUNTER — Other Ambulatory Visit: Payer: Self-pay

## 2019-12-18 ENCOUNTER — Emergency Department (INDEPENDENT_AMBULATORY_CARE_PROVIDER_SITE_OTHER): Payer: BC Managed Care – PPO

## 2019-12-18 DIAGNOSIS — L03115 Cellulitis of right lower limb: Secondary | ICD-10-CM

## 2019-12-18 DIAGNOSIS — M79604 Pain in right leg: Secondary | ICD-10-CM | POA: Diagnosis not present

## 2019-12-18 DIAGNOSIS — I8001 Phlebitis and thrombophlebitis of superficial vessels of right lower extremity: Secondary | ICD-10-CM

## 2019-12-18 DIAGNOSIS — I808 Phlebitis and thrombophlebitis of other sites: Secondary | ICD-10-CM | POA: Diagnosis not present

## 2019-12-18 DIAGNOSIS — M25471 Effusion, right ankle: Secondary | ICD-10-CM

## 2019-12-18 DIAGNOSIS — M7989 Other specified soft tissue disorders: Secondary | ICD-10-CM | POA: Diagnosis not present

## 2019-12-18 MED ORDER — CEPHALEXIN 500 MG PO CAPS: 500.0000 mg | ORAL_CAPSULE | Freq: Three times a day (TID) | ORAL | 0 refills | Status: AC

## 2019-12-18 MED ORDER — IBUPROFEN 600 MG PO TABS: 600.0000 mg | ORAL_TABLET | Freq: Four times a day (QID) | ORAL | 0 refills | Status: DC | PRN

## 2019-12-18 NOTE — ED Triage Notes (Signed)
RT ankle pain, swollen, warm to the touch x 3 days, denies injury. HX blood clot

## 2019-12-18 NOTE — ED Provider Notes (Addendum)
[facility]    CSN: [id number] Arrival date & time: 12/18/19  1247      History   Chief Complaint Chief Complaint  Patient presents with  . Ankle Pain    HPI Travis Gay is a 44 y.o. male.   HPI [name] is a 44 y.o. male presenting to UC with c/o 3 days of worsening pain, swelling and redness to Right lower leg just above his ankle. Hx of SVT in Left great saphenous vein in 2019.  He was tx with a 6 month course of eliquis due to size of the clot.  He did well on the medication and symptoms resolved but then he had another flare of similar pain and swelling of Left leg last year.  He was treated at that time with ibuprofen and warm compresses.  He has not had any flare ups until now. Denies injury to his leg. He is not on aspirin. He does not wear compression socks. He is on his feet for long hours for work. He recently changed PCP office because of insurance. He was not able to get into his PCP today.   Past Medical History:  Diagnosis Date  . Heart murmur     Patient Active Problem List   Diagnosis Date Noted  . Paresthesias 07/10/2019  . Elevated bilirubin 12/06/2018  . Elevated liver enzymes 12/06/2018  . Class 1 obesity due to excess calories without serious comorbidity with body mass index (BMI) of 30.0 to 30.9 in adult 12/02/2018  . Chronic venous stasis 12/02/2018  . Thrombophlebitis of superficial veins of left lower extremity 12/10/2017  . Peripheral edema 12/10/2017  . Asymptomatic varicose veins of both lower extremities 12/10/2017  . Overweight (BMI 25.0-29.9) 12/10/2017  . Congenital heart defect 12/03/2010    Past Surgical History:  Procedure Laterality Date  . FRACTURE SURGERY Left        Home Medications    Prior to Admission medications   Medication Sig Start Date End Date Taking? Authorizing Provider  cephALEXin (KEFLEX) 500 MG capsule Take 1 capsule (500 mg total) by mouth 3 (three) times daily for 7 days. 12/18/19 12/25/19   Noe Gens, PA-C  ibuprofen (ADVIL) 600 MG tablet Take 1 tablet (600 mg total) by mouth every 6 (six) hours as needed. 12/18/19   Noe Gens, PA-C    Family History Family History  Problem Relation Age of Onset  . Deep vein thrombosis Mother   . Heart disease Mother   . Stroke Mother   . Cancer Father     Social History Social History   Tobacco Use  . Smoking status: Never Smoker  . Smokeless tobacco: Never Used  Vaping Use  . Vaping Use: Never used  Substance Use Topics  . Alcohol use: Yes    Alcohol/week: 2.0 standard drinks    Types: 2 Standard drinks or equivalent per week  . Drug use: Never     Allergies   Patient has no known allergies.   Review of Systems Review of Systems  Constitutional: Negative for chills and fever.  Respiratory: Negative for chest tightness and shortness of breath.   Cardiovascular: Positive for leg swelling (right lower). Negative for chest pain and palpitations.  Musculoskeletal: Positive for arthralgias and joint swelling.  Skin: Positive for color change. Negative for wound.     Physical Exam Triage Vital Signs ED Triage Vitals  Enc Vitals Group     BP 12/18/19 1326 (!) 137/93  Pulse Rate 12/18/19 1326 69     Resp --      Temp 12/18/19 1326 98 F (36.7 C)     Temp Source 12/18/19 1326 Oral     SpO2 12/18/19 1326 97 %     Weight 12/18/19 1327 200 lb (90.7 kg)     Height 12/18/19 1327 6' (1.829 m)     Head Circumference --      Peak Flow --      Pain Score 12/18/19 1327 3     Pain Loc --      Pain Edu? --      Excl. in [place]? --    No data found.  Updated Vital Signs BP (!) 137/93 (BP Location: Right Arm)   Pulse 69   Temp 98 F (36.7 C) (Oral)   Ht 6' (1.829 m)   Wt 200 lb (90.7 kg)   SpO2 97%   BMI 27.12 kg/m   Visual Acuity Right Eye Distance:   Left Eye Distance:   Bilateral Distance:    Right Eye Near:   Left Eye Near:    Bilateral Near:     Physical Exam Vitals and nursing note reviewed.   Constitutional:      Appearance: Normal appearance. He is well-developed.  HENT:     Head: Normocephalic and atraumatic.  Cardiovascular:     Rate and Rhythm: Normal rate and regular rhythm.     Pulses:          Dorsalis pedis pulses are 2+ on the right side.  Pulmonary:     Effort: Pulmonary effort is normal.  Musculoskeletal:        General: Swelling and tenderness present. Normal range of motion.     Cervical back: Normal range of motion.     Comments: Right lower leg: moderate edema, erythema, warmth tenderness above ankle. Full ROM knee and ankle. No bony tenderness.   Skin:    General: Skin is warm and dry.     Capillary Refill: Capillary refill takes less than 2 seconds.     Findings: Erythema present.       Neurological:     Mental Status: He is alert and oriented to person, place, and time.  Psychiatric:        Behavior: Behavior normal.      UC Treatments / Results  Labs (all labs ordered are listed, but only abnormal results are displayed) Labs Reviewed - No data to display  EKG   Radiology US Venous Img Lower Unilateral Right  Result Date: 12/18/2019 CLINICAL DATA:  Right lower extremity pain and swelling for 3 days EXAM: RIGHT LOWER EXTREMITY VENOUS DOPPLER ULTRASOUND TECHNIQUE: Gray-scale sonography with graded compression, as well as color Doppler and duplex ultrasound were performed to evaluate the lower extremity deep venous systems from the level of the common femoral vein and including the common femoral, femoral, profunda femoral, popliteal and calf veins including the posterior tibial, peroneal and gastrocnemius veins when visible. The superficial great saphenous vein was also interrogated. Spectral Doppler was utilized to evaluate flow at rest and with distal augmentation maneuvers in the common femoral, femoral and popliteal veins. COMPARISON:  None. FINDINGS: Contralateral Common Femoral Vein: Respiratory phasicity is normal and symmetric with the  symptomatic side. No evidence of thrombus. Normal compressibility. Common Femoral Vein: No evidence of thrombus. Normal compressibility, respiratory phasicity and response to augmentation. Saphenofemoral Junction: No evidence of thrombus. Normal compressibility and flow on color Doppler imaging. Profunda Femoral Vein: No  evidence of thrombus. Normal compressibility and flow on color Doppler imaging. Femoral Vein: No evidence of thrombus. Normal compressibility, respiratory phasicity and response to augmentation. Popliteal Vein: No evidence of thrombus. Normal compressibility, respiratory phasicity and response to augmentation. Calf Veins: No evidence of thrombus. Normal compressibility and flow on color Doppler imaging. Superficial Great Saphenous Vein: Right GSV is dilated in the lower leg extending to the ankle. This usually is secondary to venous insufficiency. Venous Reflux:  Not assessed Other Findings: Right ankle area of swelling and discomfort correlates with a superficial thrombosed underlying tortuous varicosity. IMPRESSION: Negative for right lower extremity DVT. Right ankle region subserous varicosity superficial thrombosis/thrombophlebitis. Electronically Signed   By: Jerilynn Mages.  Shick M.D.   On: 12/18/2019 14:41    Procedures Procedures (including critical care time)  Medications Ordered in UC Medications - No data to display  Initial Impression / Assessment and Plan / UC Course  I have reviewed the triage vital signs and the nursing notes.  Pertinent labs & imaging results that were available during my care of the patient were reviewed by me and considered in my medical decision making (see chart for details).     Discussed imaging with pt Will tx for superficial thrombosis with ibuprofen, encouraged use of warm compresses and elevation. Will cover for potential secondary skin infection, area of skin involved is covered by pt's work boots. Pt has a hx of recurrent thrombophlebitis in Left  upper leg (no symptoms there today)  Pt reports having a new patient appointment scheduled later this week with his PCP. Will hold off on labs today as he plans to get blood drawn as part of annual physical. Encouraged to make sure they recheck his LFTs, consider checking lipase.  Advised he may need referral to vein specialist for recurrent SVTs. AVS given  Final Clinical Impressions(s) / UC Diagnoses   Final diagnoses:  Pain and swelling of right ankle  Cellulitis of right ankle  Superficial phlebitis of leg, right     Discharge Instructions      Please take your medication as prescribed. Elevate your leg 2-3 times daily and apply a warm damp washcloths 2-3 times daily for 10-15 minutest at a time.  Call to schedule a follow up appointment with primary care later this week for recheck of symptoms.  If this keeps recurring, you may need a referral to a vein specialist.    Call 911 or have someone drive you to the hospital if symptoms significantly worsening- worsening pain, swelling, chest pain, shortness of breath, or other new concerning symptoms develop.      ED Prescriptions    Medication Sig Dispense Auth. Provider   cephALEXin (KEFLEX) 500 MG capsule Take 1 capsule (500 mg total) by mouth 3 (three) times daily for 7 days. 21 capsule Gerarda Fraction, [facility] O, PA-C   ibuprofen (ADVIL) 600 MG tablet Take 1 tablet (600 mg total) by mouth every 6 (six) hours as needed. 30 tablet Noe Gens, [place]     PDMP not reviewed this encounter.     Noe Gens, PA-C 12/18/19 1529

## 2019-12-18 NOTE — Discharge Instructions (Addendum)
  Please take your medication as prescribed. Elevate your leg 2-3 times daily and apply a warm damp washcloths 2-3 times daily for 10-15 minutest at a time.  Call to schedule a follow up appointment with primary care later this week for recheck of symptoms.  If this keeps recurring, you may need a referral to a vein specialist.    Call 911 or have someone drive you to the hospital if symptoms significantly worsening- worsening pain, swelling, chest pain, shortness of breath, or other new concerning symptoms develop.

## 2019-12-28 ENCOUNTER — Encounter: Payer: Self-pay | Admitting: Family Medicine

## 2019-12-28 ENCOUNTER — Ambulatory Visit (INDEPENDENT_AMBULATORY_CARE_PROVIDER_SITE_OTHER): Payer: BC Managed Care – PPO | Admitting: Family Medicine

## 2019-12-28 VITALS — BP 121/70 | HR 72 | Temp 98.1°F | Ht 69.29 in | Wt 213.1 lb

## 2019-12-28 DIAGNOSIS — Z13220 Encounter for screening for lipoid disorders: Secondary | ICD-10-CM

## 2019-12-28 DIAGNOSIS — Z0000 Encounter for general adult medical examination without abnormal findings: Secondary | ICD-10-CM

## 2019-12-28 NOTE — Patient Instructions (Signed)
Preventive Care 41-44 Years Old, Male Preventive care refers to lifestyle choices and visits with your health care provider that can promote health and wellness. This includes:  A yearly physical exam. This is also called an annual well check.  Regular dental and eye exams.  Immunizations.  Screening for certain conditions.  Healthy lifestyle choices, such as eating a healthy diet, getting regular exercise, not using drugs or products that contain nicotine and tobacco, and limiting alcohol use. What can I expect for my preventive care visit? Physical exam Your health care provider will check:  Height and weight. These may be used to calculate body mass index (BMI), which is a measurement that tells if you are at a healthy weight.  Heart rate and blood pressure.  Your skin for abnormal spots. Counseling Your health care provider may ask you questions about:  Alcohol, tobacco, and drug use.  Emotional well-being.  Home and relationship well-being.  Sexual activity.  Eating habits.  Work and work Statistician. What immunizations do I need?  Influenza (flu) vaccine  This is recommended every year. Tetanus, diphtheria, and pertussis (Tdap) vaccine  You may need a Td booster every 10 years. Varicella (chickenpox) vaccine  You may need this vaccine if you have not already been vaccinated. Zoster (shingles) vaccine  You may need this after age 64. Measles, mumps, and rubella (MMR) vaccine  You may need at least one dose of MMR if you were born in 1957 or later. You may also need a second dose. Pneumococcal conjugate (PCV13) vaccine  You may need this if you have certain conditions and were not previously vaccinated. Pneumococcal polysaccharide (PPSV23) vaccine  You may need one or two doses if you smoke cigarettes or if you have certain conditions. Meningococcal conjugate (MenACWY) vaccine  You may need this if you have certain conditions. Hepatitis A  vaccine  You may need this if you have certain conditions or if you travel or work in places where you may be exposed to hepatitis A. Hepatitis B vaccine  You may need this if you have certain conditions or if you travel or work in places where you may be exposed to hepatitis B. Haemophilus influenzae type b (Hib) vaccine  You may need this if you have certain risk factors. Human papillomavirus (HPV) vaccine  If recommended by your health care provider, you may need three doses over 6 months. You may receive vaccines as individual doses or as more than one vaccine together in one shot (combination vaccines). Talk with your health care provider about the risks and benefits of combination vaccines. What tests do I need? Blood tests  Lipid and cholesterol levels. These may be checked every 5 years, or more frequently if you are over 60 years old.  Hepatitis C test.  Hepatitis B test. Screening  Lung cancer screening. You may have this screening every year starting at age 43 if you have a 30-pack-year history of smoking and currently smoke or have quit within the past 15 years.  Prostate cancer screening. Recommendations will vary depending on your family history and other risks.  Colorectal cancer screening. All adults should have this screening starting at age 72 and continuing until age 2. Your health care provider may recommend screening at age 14 if you are at increased risk. You will have tests every 1-10 years, depending on your results and the type of screening test.  Diabetes screening. This is done by checking your blood sugar (glucose) after you have not eaten  for a while (fasting). You may have this done every 1-3 years.  Sexually transmitted disease (STD) testing. Follow these instructions at home: Eating and drinking  Eat a diet that includes fresh fruits and vegetables, whole grains, lean protein, and low-fat dairy products.  Take vitamin and mineral supplements as  recommended by your health care provider.  Do not drink alcohol if your health care provider tells you not to drink.  If you drink alcohol: ? Limit how much you have to 0-2 drinks a day. ? Be aware of how much alcohol is in your drink. In the [place] one drink equals one 12 oz bottle of beer (355 mL), one 5 oz glass of wine (148 mL), or one 1 oz glass of hard liquor (44 mL). Lifestyle  Take daily care of your teeth and gums.  Stay active. Exercise for at least 30 minutes on 5 or more days each week.  Do not use any products that contain nicotine or tobacco, such as cigarettes, e-cigarettes, and chewing tobacco. If you need help quitting, ask your health care provider.  If you are sexually active, practice safe sex. Use a condom or other form of protection to prevent STIs (sexually transmitted infections).  Talk with your health care provider about taking a low-dose aspirin every day starting at age 53. What's next?  Go to your health care provider once a year for a well check visit.  Ask your health care provider how often you should have your eyes and teeth checked.  Stay up to date on all vaccines. This information is not intended to replace advice given to you by your health care provider. Make sure you discuss any questions you have with your health care provider. Document Revised: 01/27/2018 Document Reviewed: 01/27/2018 Elsevier Patient Education  2020 [employer].

## 2019-12-28 NOTE — Progress Notes (Signed)
Travis Gay - 44 y.o. male MRN [id number]  Date of birth: 1975/02/21  Subjective Chief Complaint  Patient presents with  . Annual Exam    HPI Travis Gay is a 44 y.o. male here today for annual exam.  He had recent visit to urgent care for redness and swelling of leg.  Diagnosed with thrombophlebitis.  Started on ibuprofen and cephalexin was also added for possible cellulitis.  This has resolved at this point.    He has no new concerns today.   He is a non-smoker.  He has a couple of servings of EtOH each week.    His job keeps him pretty active.  He admits that diet could use some work.    He declines flu vaccine and does not plan to get COVID vaccine.    Review of Systems  Constitutional: Negative for chills, fever, malaise/fatigue and weight loss.  HENT: Negative for congestion, ear pain and sore throat.   Eyes: Negative for blurred vision, double vision and pain.  Respiratory: Negative for cough and shortness of breath.   Cardiovascular: Negative for chest pain and palpitations.  Gastrointestinal: Negative for abdominal pain, blood in stool, constipation, heartburn and nausea.  Genitourinary: Negative for dysuria and urgency.  Musculoskeletal: Negative for joint pain and myalgias.  Neurological: Negative for dizziness and headaches.  Endo/Heme/Allergies: Does not bruise/bleed easily.  Psychiatric/Behavioral: Negative for depression. The patient is not nervous/anxious and does not have insomnia.     No Known Allergies  Past Medical History:  Diagnosis Date  . Heart murmur     Past Surgical History:  Procedure Laterality Date  . FRACTURE SURGERY Left     Social History   Socioeconomic History  . Marital status: Married    Spouse name: Not on file  . Number of children: Not on file  . Years of education: Not on file  . Highest education level: Not on file  Occupational History  . Not on file  Tobacco Use  . Smoking status: Never Smoker  . Smokeless  tobacco: Never Used  Vaping Use  . Vaping Use: Never used  Substance and Sexual Activity  . Alcohol use: Yes    Alcohol/week: 2.0 standard drinks    Types: 2 Standard drinks or equivalent per week  . Drug use: Never  . Sexual activity: Yes    Birth control/protection: Condom  Other Topics Concern  . Not on file  Social History Narrative  . Not on file   Social Determinants of Health   Financial Resource Strain:   . Difficulty of Paying Living Expenses: Not on file  Food Insecurity:   . Worried About Charity fundraiser in the Last Year: Not on file  . Ran Out of Food in the Last Year: Not on file  Transportation Needs:   . Lack of Transportation (Medical): Not on file  . Lack of Transportation (Non-Medical): Not on file  Physical Activity:   . Days of Exercise per Week: Not on file  . Minutes of Exercise per Session: Not on file  Stress:   . Feeling of Stress : Not on file  Social Connections:   . Frequency of Communication with Friends and Family: Not on file  . Frequency of Social Gatherings with Friends and Family: Not on file  . Attends Religious Services: Not on file  . Active Member of Clubs or Organizations: Not on file  . Attends Archivist Meetings: Not on file  . Marital Status:  Not on file    Family History  Problem Relation Age of Onset  . Deep vein thrombosis Mother   . Heart disease Mother   . Stroke Mother   . Cancer Father     Health Maintenance  Topic Date Due  . Hepatitis C Screening  Never done  . HIV Screening  Never done  . COVID-19 Vaccine (1) 01/12/2021 (Originally 07/13/1987)  . TETANUS/TDAP  05/21/2027  . INFLUENZA VACCINE  Discontinued     ----------------------------------------------------------------------------------------------------------------------------------------------------------------------------------------------------------------- Physical Exam BP 121/70 (BP Location: Left Arm, Patient Position: Sitting, Cuff  Size: Normal)   Pulse 72   Temp 98.1 F (36.7 C)   Ht 5' 9.29" (1.76 m)   Wt 213 lb 1.6 oz (96.7 kg)   SpO2 99%   BMI 31.21 kg/m   Physical Exam Constitutional:      General: He is not in acute distress. HENT:     Head: Normocephalic and atraumatic.     Right Ear: External ear normal.     Left Ear: External ear normal.  Eyes:     General: No scleral icterus. Neck:     Thyroid: No thyromegaly.  Cardiovascular:     Rate and Rhythm: Normal rate and regular rhythm.     Heart sounds: Normal heart sounds.  Pulmonary:     Effort: Pulmonary effort is normal.     Breath sounds: Normal breath sounds.  Abdominal:     General: Bowel sounds are normal. There is no distension.     Palpations: Abdomen is soft.     Tenderness: There is no abdominal tenderness. There is no guarding.  Musculoskeletal:     Cervical back: Normal range of motion.  Lymphadenopathy:     Cervical: No cervical adenopathy.  Skin:    General: Skin is warm and dry.     Findings: No rash.  Neurological:     Mental Status: He is alert and oriented to person, place, and time.     Cranial Nerves: No cranial nerve deficit.     Motor: No abnormal muscle tone.  Psychiatric:        Mood and Affect: Mood normal.        Behavior: Behavior normal.     ------------------------------------------------------------------------------------------------------------------------------------------------------------------------------------------------------------------- Assessment and Plan  Well adult exam Well adult Orders Placed This Encounter  Procedures  . COMPLETE METABOLIC PANEL WITH GFR  . CBC  . Lipid Profile  Screening: Lipid panel Immunizations:  Flu and COVID vaccine recommended, declines  Anticipatory guidance/Risk factor reduction: Recommendations per AVS   No orders of the defined types were placed in this encounter.   No follow-ups on file.    This visit occurred during the SARS-CoV-2 public health  emergency.  Safety protocols were in place, including screening questions prior to the visit, additional usage of staff PPE, and extensive cleaning of exam room while observing appropriate contact time as indicated for disinfecting solutions.

## 2019-12-28 NOTE — Assessment & Plan Note (Signed)
Well adult Orders Placed This Encounter  Procedures  . COMPLETE METABOLIC PANEL WITH GFR  . CBC  . Lipid Profile  Screening: Lipid panel Immunizations:  Flu and COVID vaccine recommended, declines  Anticipatory guidance/Risk factor reduction: Recommendations per AVS

## 2020-01-08 DIAGNOSIS — Z0000 Encounter for general adult medical examination without abnormal findings: Secondary | ICD-10-CM | POA: Diagnosis not present

## 2020-01-08 DIAGNOSIS — Z13220 Encounter for screening for lipoid disorders: Secondary | ICD-10-CM | POA: Diagnosis not present

## 2020-01-08 LAB — CBC
MCH: 34.4 pg — ABNORMAL HIGH (ref 27.0–33.0)
MCV: 98.3 fL (ref 80.0–100.0)
Platelets: 166 10*3/uL (ref 140–400)

## 2020-01-09 LAB — COMPLETE METABOLIC PANEL WITH GFR
AG Ratio: 1.9 (calc) (ref 1.0–2.5)
ALT: 29 U/L (ref 9–46)
AST: 38 U/L (ref 10–40)
Albumin: 3.7 g/dL (ref 3.6–5.1)
Alkaline phosphatase (APISO): 82 U/L (ref 36–130)
BUN: 8 mg/dL (ref 7–25)
CO2: 24 mmol/L (ref 20–32)
Calcium: 8.6 mg/dL (ref 8.6–10.3)
Chloride: 109 mmol/L (ref 98–110)
Creat: 0.8 mg/dL (ref 0.60–1.35)
GFR, Est African American: 126 mL/min/{1.73_m2} (ref >=60)
GFR, Est Non African American: 109 mL/min/{1.73_m2} (ref >=60)
Globulin: 2 g/dL (calc) (ref 1.9–3.7)
Glucose, Bld: 91 mg/dL (ref 65–99)
Potassium: 4.1 mmol/L (ref 3.5–5.3)
Sodium: 141 mmol/L (ref 135–146)
Total Bilirubin: 1 mg/dL (ref 0.2–1.2)
Total Protein: 5.7 g/dL — ABNORMAL LOW (ref 6.1–8.1)

## 2020-01-09 LAB — LIPID PANEL
Cholesterol: 184 mg/dL (ref <200)
HDL: 56 mg/dL (ref >=40)
LDL Cholesterol (Calc): 105 mg/dL (calc) — ABNORMAL HIGH
Non-HDL Cholesterol (Calc): 128 mg/dL (calc) (ref <130)
Total CHOL/HDL Ratio: 3.3 (calc) (ref <5.0)
Triglycerides: 136 mg/dL (ref <150)

## 2020-01-09 LAB — CBC
HCT: 46.9 % (ref 38.5–50.0)
Hemoglobin: 16.4 g/dL (ref 13.2–17.1)
MCHC: 35 g/dL (ref 32.0–36.0)
MPV: 11.5 fL (ref 7.5–12.5)
RBC: 4.77 10*6/uL (ref 4.20–5.80)
RDW: 12.9 % (ref 11.0–15.0)
WBC: 3.9 10*3/uL (ref 3.8–10.8)

## 2020-05-23 ENCOUNTER — Other Ambulatory Visit: Payer: Self-pay

## 2020-05-23 ENCOUNTER — Emergency Department (INDEPENDENT_AMBULATORY_CARE_PROVIDER_SITE_OTHER)
Admission: EM | Admit: 2020-05-23 | Discharge: 2020-05-23 | Disposition: A | Discharge status: Home / Self Care | Payer: BC Managed Care – PPO | Source: Home / Self Care | Attending: Emergency Medicine | Admitting: Emergency Medicine

## 2020-05-23 ENCOUNTER — Emergency Department (INDEPENDENT_AMBULATORY_CARE_PROVIDER_SITE_OTHER): Payer: BC Managed Care – PPO

## 2020-05-23 DIAGNOSIS — M25572 Pain in left ankle and joints of left foot: Secondary | ICD-10-CM

## 2020-05-23 DIAGNOSIS — S93422A Sprain of deltoid ligament of left ankle, initial encounter: Secondary | ICD-10-CM

## 2020-05-23 DIAGNOSIS — Z043 Encounter for examination and observation following other accident: Secondary | ICD-10-CM | POA: Diagnosis not present

## 2020-05-23 DIAGNOSIS — S93602A Unspecified sprain of left foot, initial encounter: Secondary | ICD-10-CM | POA: Diagnosis not present

## 2020-05-23 MED ORDER — IBUPROFEN 600 MG PO TABS: 600.0000 mg | ORAL_TABLET | Freq: Four times a day (QID) | ORAL | 0 refills | Status: DC | PRN

## 2020-05-23 NOTE — Discharge Instructions (Addendum)
Take 600 mg of ibuprofen combined with 1000 mg of Tylenol together 3-4 times a day as needed for pain.  Wear the ASO for the next several weeks.  Ice, elevate above your heart is much as possible.

## 2020-05-23 NOTE — ED Provider Notes (Signed)
HPI  SUBJECTIVE:  Travis Gay is a 45 y.o. male who presents with medial ankle and foot/arch pain after a trip and fall 5 days ago.  He cannot characterize the pain.  He reports swelling, bruising.  No erythema.  No numbness tingling distally.  He is able to move his ankle and toes without much difficulty.  He tried 400 mg of ibuprofen with improvement in his symptoms.  Symptoms are also better with nonweightbearing, worse with weightbearing.  He has a past medical history of recurrent sprains of his left ankle.  No previous history of foot injury.  PMD: [facility]    Past Medical History:  Diagnosis Date  . Heart murmur     Past Surgical History:  Procedure Laterality Date  . FRACTURE SURGERY Left     Family History  Problem Relation Age of Onset  . Deep vein thrombosis Mother   . Heart disease Mother   . Stroke Mother   . Cancer Father     Social History   Tobacco Use  . Smoking status: Never Smoker  . Smokeless tobacco: Never Used  Vaping Use  . Vaping Use: Never used  Substance Use Topics  . Alcohol use: Yes    Alcohol/week: 2.0 standard drinks    Types: 2 Standard drinks or equivalent per week  . Drug use: Never    No current facility-administered medications for this encounter.  Current Outpatient Medications:  .  ibuprofen (ADVIL) 600 MG tablet, Take 1 tablet (600 mg total) by mouth every 6 (six) hours as needed., Disp: 30 tablet, Rfl: 0  No Known Allergies   ROS  As noted in HPI.   Physical Exam  BP 133/87 (BP Location: Right Arm)   Pulse 80   Temp 98 F (36.7 C) (Oral)   Resp 17   SpO2 97%   Constitutional: Well developed, well nourished, no acute distress Eyes:  EOMI, conjunctiva normal bilaterally HENT: Normocephalic, atraumatic,mucus membranes moist Respiratory: Normal inspiratory effort Cardiovascular: Normal rate GI: nondistended skin: No rash, skin intact Musculoskeletal: Soft tissue swelling left ankle.  Positive  tenderness over deltoid ligaments and over the medial foot inferior to the medial malleolus.  L Ankle Proximal fibula NT , Distal fibula NT, Medial malleolus NT Deltoid ligaments medially tender,  ATFL NT, calcaneofibular ligament NT, posterior tablofibular ligament NT ,  Achilles NT, calcaneus  NT,  Proximal 5th metatarsal NT, Midfoot NT, distal NVI with baseline sensation / motor to foot with DP 2+.  no pain with dorsiflexion/plantar flexion. no pain with inversion/eversion. + mild  bruising.  - squeeze test. Ant drawer test stable. Pt able to bear weight in dept.  Neurologic: Alert & oriented x 3, no focal neuro deficits Psychiatric: Speech and behavior appropriate   ED Course   Medications - No data to display  Orders Placed This Encounter  Procedures  . DG Foot Complete Left    Standing Status:   Standing    Number of Occurrences:   1    Order Specific Question:   Reason for Exam (SYMPTOM  OR DIAGNOSIS REQUIRED)    Answer:   Fall; injury  . DG Ankle Complete Left    Standing Status:   Standing    Number of Occurrences:   1    Order Specific Question:   Reason for Exam (SYMPTOM  OR DIAGNOSIS REQUIRED)    Answer:   Fall; injury    No results found for this or any previous visit (from  the past 24 hour(s)). DG Ankle Complete Left  Addendum Date: 05/23/2020   ADDENDUM REPORT: 05/23/2020 17:54 ADDENDUM: PLEASE NOTE: All findings and impression above should state LEFT foot and ankle. Electronically Signed   By: Iven Finn M.D.   On: 05/23/2020 17:54   Result Date: 05/23/2020 CLINICAL DATA:  Status post fall.  Old left ankle 5 days ago. EXAM: LEFT FOOT - COMPLETE 3+ VIEW; LEFT ANKLE COMPLETE - 3+ VIEW COMPARISON:  None. FINDINGS: Right foot: There is no evidence of fracture or dislocation. Cortical irregularity along the cuboid does not appear to be acute. There is no evidence of arthropathy or other focal bone abnormality. Subcutaneus soft tissue edema of the ankle. Right ankle: No  evidence of fracture, dislocation, or joint effusion. No clear or medial clear space widening. No evidence of severe arthropathy. No aggressive appearing focal bone abnormality. Diffuse subcutaneus soft tissue edema that is worse medially compared to laterally. IMPRESSION: No acute displaced fracture or dislocation of the right foot/ankle. Electronically Signed: By: Iven Finn M.D. On: 05/23/2020 16:40   DG Foot Complete Left  Addendum Date: 05/23/2020   ADDENDUM REPORT: 05/23/2020 17:54 ADDENDUM: PLEASE NOTE: All findings and impression above should state LEFT foot and ankle. Electronically Signed   By: Iven Finn M.D.   On: 05/23/2020 17:54   Result Date: 05/23/2020 CLINICAL DATA:  Status post fall.  Old left ankle 5 days ago. EXAM: LEFT FOOT - COMPLETE 3+ VIEW; LEFT ANKLE COMPLETE - 3+ VIEW COMPARISON:  None. FINDINGS: Right foot: There is no evidence of fracture or dislocation. Cortical irregularity along the cuboid does not appear to be acute. There is no evidence of arthropathy or other focal bone abnormality. Subcutaneus soft tissue edema of the ankle. Right ankle: No evidence of fracture, dislocation, or joint effusion. No clear or medial clear space widening. No evidence of severe arthropathy. No aggressive appearing focal bone abnormality. Diffuse subcutaneus soft tissue edema that is worse medially compared to laterally. IMPRESSION: No acute displaced fracture or dislocation of the right foot/ankle. Electronically Signed: By: Iven Finn M.D. On: 05/23/2020 16:40    ED Clinical Impression  1. Sprain of deltoid ligament of left ankle, initial encounter   2. Sprain of left foot, initial encounter      ED Assessment/Plan   Reviewed imaging independently.  No fracture, dislocation of left foot/ankle.  Diffuse subcutaneous soft tissue edema worse medially of the ankle.  See radiology report for full details.  Patient with a left deltoid ankle ligament sprain and medial foot  sprain.  Placing an ASO, ice, elevate, Tylenol/ibuprofen.  He is already a patient at [facility] primary care, will have him follow-up with Dr. Dianah Field  within the next week.  Work note for 2 days.  He is not working over the weekend.  Weightbearing as tolerated.  Discussed, imaging, MDM, treatment plan, and plan for follow-up with patient. . patient agrees with plan.   Meds ordered this encounter  Medications  . ibuprofen (ADVIL) 600 MG tablet    Sig: Take 1 tablet (600 mg total) by mouth every 6 (six) hours as needed.    Dispense:  30 tablet    Refill:  0      *This clinic note was created using [employer]. Therefore, there may be occasional mistakes despite careful proofreading.  ?    Melynda Ripple, MD 05/24/20 [phone]

## 2020-05-23 NOTE — ED Triage Notes (Addendum)
Pt c/o LT foot pain since Saturday when he fell in the yard. Pain it mostly in arch and inside of ankle. Pain 5-6/10 when walking. Elevating and motrin prn.

## 2020-10-11 DIAGNOSIS — Z20822 Contact with and (suspected) exposure to covid-19: Secondary | ICD-10-CM | POA: Diagnosis not present

## 2020-10-21 DIAGNOSIS — R7989 Other specified abnormal findings of blood chemistry: Secondary | ICD-10-CM | POA: Diagnosis not present

## 2020-10-21 DIAGNOSIS — M7989 Other specified soft tissue disorders: Secondary | ICD-10-CM | POA: Diagnosis not present

## 2020-10-21 DIAGNOSIS — R600 Localized edema: Secondary | ICD-10-CM | POA: Diagnosis not present

## 2020-11-07 ENCOUNTER — Other Ambulatory Visit: Payer: Self-pay

## 2020-11-07 ENCOUNTER — Ambulatory Visit (INDEPENDENT_AMBULATORY_CARE_PROVIDER_SITE_OTHER): Payer: BC Managed Care – PPO | Admitting: Family Medicine

## 2020-11-07 ENCOUNTER — Encounter: Payer: Self-pay | Admitting: Family Medicine

## 2020-11-07 DIAGNOSIS — I878 Other specified disorders of veins: Secondary | ICD-10-CM

## 2020-11-07 NOTE — Assessment & Plan Note (Signed)
Swelling in his legs has improved at this point.  He did have bilateral saphenous thrombus however no DVT.  I recommend that he wear compression stockings especially with traveling.  He may also want to consider taking an aspirin when flying or traveling for long distance.

## 2020-11-07 NOTE — Patient Instructions (Signed)
Let me know if symptoms return.  I would recommend a low dose aspirin and compression stocking in the future before air travel or prolonged car trips.

## 2020-11-07 NOTE — Progress Notes (Signed)
Travis Gay - 45 y.o. male MRN [id number]  Date of birth: [birth year]  Subjective Chief Complaint  Patient presents with   Hospitalization Follow-up    HPI Lion is a 45 year old male here today with complaint of bilateral leg swelling.  Recently traveled to [place] for a cruise and noted that his legs were swollen once he got there.  Swelling was not painful.  This persisted throughout his cruise and he was seen at emergency room at [facility] in [place].  He did have an ultrasound of his lower extremities as well as CT angio.  This was negative for PE and DVT.  It did show bilateral clots within the saphenous vein.  He was given prescription for hydrochlorothiazide however this caused stomach upset so he has not been taking this.  He reports that swelling has resolved on its own at this point.  He denies chest pain, shortness of breath, fever or chills.  ROS:  A comprehensive ROS was completed and negative except as noted per HPI  No Known Allergies  Past Medical History:  Diagnosis Date   Heart murmur     Past Surgical History:  Procedure Laterality Date   FRACTURE SURGERY Left     Social History   Socioeconomic History   Marital status: Single    Spouse name: Not on file   Number of children: Not on file   Years of education: Not on file   Highest education level: Not on file  Occupational History   Not on file  Tobacco Use   Smoking status: Never   Smokeless tobacco: Never  Vaping Use   Vaping Use: Never used  Substance and Sexual Activity   Alcohol use: Yes    Alcohol/week: 2.0 standard drinks    Types: 2 Standard drinks or equivalent per week   Drug use: Never   Sexual activity: Yes    Birth control/protection: Condom  Other Topics Concern   Not on file  Social History Narrative   Not on file   Social Determinants of Health   Financial Resource Strain: Not on file  Food Insecurity: Not on file  Transportation Needs: Not on file  Physical Activity:  Not on file  Stress: Not on file  Social Connections: Not on file    Family History  Problem Relation Age of Onset   Deep vein thrombosis Mother    Heart disease Mother    Stroke Mother    Cancer Father     Health Maintenance  Topic Date Due   HIV Screening  Never done   Hepatitis C Screening  Never done   COLONOSCOPY (Pts 45-61yrs Insurance coverage will need to be confirmed)  Never done   COVID-19 Vaccine (1) 01/12/2021 (Originally 01/13/1976)   TETANUS/TDAP  05/21/2027   HPV VACCINES  Aged Out   INFLUENZA VACCINE  Discontinued     ----------------------------------------------------------------------------------------------------------------------------------------------------------------------------------------------------------------- Physical Exam BP 126/87   Pulse 84   Ht 6' (1.829 m)   Wt 220 lb (99.8 kg)   SpO2 95%   BMI 29.84 kg/m   Physical Exam Constitutional:      Appearance: Normal appearance.  Eyes:     General: No scleral icterus. Cardiovascular:     Rate and Rhythm: Normal rate and regular rhythm.  Pulmonary:     Effort: Pulmonary effort is normal.     Breath sounds: Normal breath sounds.  Musculoskeletal:     Cervical back: Neck supple.     Right lower leg: No edema.  Left lower leg: No edema.  Neurological:     General: No focal deficit present.     Mental Status: He is alert.  Psychiatric:        Mood and Affect: Mood normal.        Behavior: Behavior normal.    ------------------------------------------------------------------------------------------------------------------------------------------------------------------------------------------------------------------- Assessment and Plan  Chronic venous stasis Swelling in his legs has improved at this point.  He did have bilateral saphenous thrombus however no DVT.  I recommend that he wear compression stockings especially with traveling.  He may also want to consider taking an  aspirin when flying or traveling for long distance.   No orders of the defined types were placed in this encounter.   No follow-ups on file.    This visit occurred during the SARS-CoV-2 public health emergency.  Safety protocols were in place, including screening questions prior to the visit, additional usage of staff PPE, and extensive cleaning of exam room while observing appropriate contact time as indicated for disinfecting solutions.

## 2021-02-25 DIAGNOSIS — K635 Polyp of colon: Secondary | ICD-10-CM | POA: Diagnosis not present

## 2021-02-25 DIAGNOSIS — D122 Benign neoplasm of ascending colon: Secondary | ICD-10-CM | POA: Diagnosis not present

## 2021-02-25 DIAGNOSIS — D128 Benign neoplasm of rectum: Secondary | ICD-10-CM | POA: Diagnosis not present

## 2021-02-25 DIAGNOSIS — Z1211 Encounter for screening for malignant neoplasm of colon: Secondary | ICD-10-CM | POA: Diagnosis not present

## 2021-02-27 LAB — HM COLONOSCOPY

## 2021-04-11 DIAGNOSIS — J208 Acute bronchitis due to other specified organisms: Secondary | ICD-10-CM | POA: Diagnosis not present

## 2021-04-11 DIAGNOSIS — J042 Acute laryngotracheitis: Secondary | ICD-10-CM | POA: Diagnosis not present

## 2021-04-11 DIAGNOSIS — R490 Dysphonia: Secondary | ICD-10-CM | POA: Diagnosis not present

## 2021-04-11 DIAGNOSIS — Z20822 Contact with and (suspected) exposure to covid-19: Secondary | ICD-10-CM | POA: Diagnosis not present

## 2021-05-28 DIAGNOSIS — Z20822 Contact with and (suspected) exposure to covid-19: Secondary | ICD-10-CM | POA: Diagnosis not present

## 2021-05-28 DIAGNOSIS — J101 Influenza due to other identified influenza virus with other respiratory manifestations: Secondary | ICD-10-CM | POA: Diagnosis not present

## 2021-06-26 DIAGNOSIS — M6281 Muscle weakness (generalized): Secondary | ICD-10-CM | POA: Diagnosis not present

## 2021-06-26 DIAGNOSIS — M7989 Other specified soft tissue disorders: Secondary | ICD-10-CM | POA: Diagnosis not present

## 2021-06-26 DIAGNOSIS — S61216A Laceration without foreign body of right little finger without damage to nail, initial encounter: Secondary | ICD-10-CM | POA: Diagnosis not present

## 2021-06-26 DIAGNOSIS — M79641 Pain in right hand: Secondary | ICD-10-CM | POA: Diagnosis not present

## 2021-06-26 DIAGNOSIS — X58XXXA Exposure to other specified factors, initial encounter: Secondary | ICD-10-CM | POA: Diagnosis not present

## 2021-07-08 ENCOUNTER — Ambulatory Visit (INDEPENDENT_AMBULATORY_CARE_PROVIDER_SITE_OTHER): Payer: BC Managed Care – PPO | Admitting: Sports Medicine

## 2021-07-08 DIAGNOSIS — I8002 Phlebitis and thrombophlebitis of superficial vessels of left lower extremity: Secondary | ICD-10-CM | POA: Diagnosis not present

## 2021-07-08 DIAGNOSIS — D239 Other benign neoplasm of skin, unspecified: Secondary | ICD-10-CM | POA: Diagnosis not present

## 2021-07-08 NOTE — Assessment & Plan Note (Signed)
Chronic bilateral right worse than left lower extremity swelling with dependent edema. DVT ultrasound in the past has shown SVT in the saphenous veins bilaterally. I have advised him to wear his compression stockings every day. Elevate legs when possible. Negative Bevelyn Buckles' sign today.

## 2021-07-08 NOTE — Assessment & Plan Note (Signed)
Mid back dysplastic nevus, return in 15-minute slot for biopsy.

## 2021-07-08 NOTE — Progress Notes (Signed)
    Procedures performed today:    None.  Independent interpretation of notes and tests performed by another provider:   None.  Brief History, Exam, Impression, and Recommendations:    Thrombophlebitis of superficial veins of left lower extremity Chronic bilateral right worse than left lower extremity swelling with dependent edema. DVT ultrasound in the past has shown SVT in the saphenous veins bilaterally. I have advised him to wear his compression stockings every day. Elevate legs when possible. Negative Bevelyn Buckles' sign today.   Dysplastic nevus Mid back dysplastic nevus, return in 15-minute slot for biopsy.    ___________________________________________ Gwen Her. Dianah Field, M.D., ABFM., CAQSM. Primary Care and [place] Instructor of [place] of [facility] [employer]

## 2021-07-11 ENCOUNTER — Ambulatory Visit (INDEPENDENT_AMBULATORY_CARE_PROVIDER_SITE_OTHER): Payer: BC Managed Care – PPO | Admitting: Sports Medicine

## 2021-07-11 ENCOUNTER — Other Ambulatory Visit: Payer: Self-pay | Admitting: Sports Medicine

## 2021-07-11 DIAGNOSIS — D239 Other benign neoplasm of skin, unspecified: Secondary | ICD-10-CM

## 2021-07-11 DIAGNOSIS — D225 Melanocytic nevi of trunk: Secondary | ICD-10-CM | POA: Diagnosis not present

## 2021-07-11 NOTE — Assessment & Plan Note (Signed)
Punch biopsy today, return in 10 days for suture removal.

## 2021-07-11 NOTE — Progress Notes (Signed)
    Procedures performed today:    Procedure: 6 mm punch biopsy 0.6 cm suspicious skin lesion right low back Risks, benefits, and alternatives explained and consent obtained. Time out conducted. Surface prepped with alcohol. 3cc lidocaine without epinephine infiltrated in a field block. Adequate anesthesia ensured. Area prepped and draped in a sterile fashion. Excision performed with: I made a full-thickness punch with a 6 mm biopsy, lesion was removed and sent off for dermatopathology, I then closed the skin defect with 3 simple interrupted 4-0 Prolene sutures. Hemostasis achieved. Pt stable.  Independent interpretation of notes and tests performed by another provider:   None.  Brief History, Exam, Impression, and Recommendations:    Dysplastic nevus Punch biopsy today, return in 10 days for suture removal.    ___________________________________________ Gwen Her. Dianah Field, M.D., ABFM., CAQSM. Primary Care and [place] Instructor of [place] of [facility] of [employer]

## 2021-07-11 NOTE — Patient Instructions (Signed)
Incision Care, Adult An incision is a surgical cut that is made through your skin. Most incisions are closed after a surgical procedure. Your incision may be closed with stitches (sutures), staples, skin glue, or adhesive strips. You may need to return to your health care provider to have sutures or staples removed. This may occur several days or several weeks after your surgery. Until then, the incision needs to be cared for properly to prevent infection. Follow instructions from your health care provider about how to care for your incision. Supplies needed: Soap, water, and a clean hand towel. Wound cleanser. A clean bandage (dressing), if needed. Cream or ointment, if told by your health care provider. Clean gauze. How to care for your incision Cleaning the incision Ask your health care provider how to clean the incision. This may include: Wearing medical gloves. Using mild soap and water, or wound cleanser. Using a clean gauze to pat the incision dry after cleaning it. Dressing changes Wash your hands with soap and water for at least 20 seconds before and after you change the dressing. If soap and water are not available, use hand sanitizer. Do not use disinfectants or antiseptics, such as rubbing alcohol, to clean open wounds unless told by your health care provider. Change your dressing as told by your health care provider. Leave sutures, staples, skin glue, or adhesive strips in place. These skin closures may need to stay in place for 2 weeks or longer. If adhesive strip edges start to loosen and curl up, you may trim the loose edges. Do not remove adhesive strips completely unless your health care provider tells you to do that. Apply cream or ointment. Do this only as told by your health care provider. Cover the incision with a clean dressing. Ask your health care provider when you can start to leave the incision uncovered. Checking for infection Check your incision area every day for  signs of infection. Check for: More redness, swelling, or pain. More fluid or blood. New warmth, hardness, or a rash that develops along the incision. Pus or a bad smell.  Follow these instructions at home Medicines Take over-the-counter and prescription medicines only as told by your health care provider. If you were prescribed an antibiotic medicine, cream, or ointment, take or apply it as told by your health care provider. Do not stop using the antibiotic even if your condition improves. Eating and drinking Eat a diet that includes protein, vitamin A, vitamin C, and other nutrient-rich foods to help the wound heal. Foods rich in protein include meat, fish, eggs, dairy, beans, nuts, and protein supplement drinks. Foods rich in vitamin A include carrots and dark green, leafy vegetables. Foods rich in vitamin C include citrus fruits, tomatoes, broccoli, and peppers. Drink enough fluid to keep your urine pale yellow. General instructions  Do not take baths, swim, use a hot tub, or do anything that would put the incision underwater until your health care provider approves. Ask your health care provider if you may take showers. You may only be allowed to take sponge baths. Limit movement around your incision to promote healing. Avoid straining, lifting, or exercising for the first 2 weeks after your procedure, or for as long as told by your health care provider. Return to your normal activities as told by your health care provider. Ask your health care provider what activities are safe for you. Do not scratch, pick, or scrub the incision. Keep it covered as told by your health care provider.   Protect your incision from the sun when you are outside for the first 6 months, or for as long as told by your health care provider. Cover up the scar area or apply sunscreen that has an SPF of at least 30. Do not use any products that contain nicotine or tobacco, such as cigarettes, e-cigarettes, and  chewing tobacco. These can delay incision healing. If you need help quitting, ask your health care provider. Keep all follow-up visits. This is important. Contact a health care provider if: You have any of these signs of infection: More redness, swelling, or pain around your incision. More fluid or blood coming from your incision. New warmth or hardness around your incision. Pus or a bad smell coming from your incision. A rash that develops along the incision. You feel nauseous or you vomit. You are dizzy. Your sutures, staples, skin glue, or adhesive strips come undone. Your wound gets bigger. You have a fever. Get help right away if: Your incision bleeds through the dressing and the bleeding does not stop with gentle pressure. The edges of your incision open up and separate. These symptoms may represent a serious problem that is an emergency. Do not wait to see if the symptoms will go away. Get medical help right away. Call your local emergency services (911 in the [place] Do not drive yourself to the hospital. Summary Follow instructions from your health care provider about how to care for your incision. Wash your hands with soap and water for at least 20 seconds before and after you change the dressing. If soap and water are not available, use hand sanitizer. Check your incision area every day for signs of infection. Keep all follow-up visits. This is important. This information is not intended to replace advice given to you by your health care provider. Make sure you discuss any questions you have with your health care provider. Document Revised: 05/06/2020 Document Reviewed: 05/06/2020 Elsevier Patient Education  2023 Elsevier Inc.  

## 2021-07-11 NOTE — Addendum Note (Signed)
Addended by: Dema Severin on: 07/11/2021 11:51 AM   Modules accepted: Orders

## 2021-07-22 ENCOUNTER — Ambulatory Visit (INDEPENDENT_AMBULATORY_CARE_PROVIDER_SITE_OTHER): Payer: BC Managed Care – PPO | Admitting: Sports Medicine

## 2021-07-22 DIAGNOSIS — D239 Other benign neoplasm of skin, unspecified: Secondary | ICD-10-CM

## 2021-07-22 NOTE — Assessment & Plan Note (Signed)
Travis Gay returns, we did a punch biopsy 10 days ago, sutures removed today. Dermatopathology did show dysplastic compound nevus, margins clear. We will keep an eye out for recurrences elsewhere in his body.

## 2021-07-22 NOTE — Progress Notes (Signed)
    Procedures performed today:    None.  Independent interpretation of notes and tests performed by another provider:   None.  Brief History, Exam, Impression, and Recommendations:    Dysplastic nevus Travis Gay returns, we did a punch biopsy 10 days ago, sutures removed today. Dermatopathology did show dysplastic compound nevus, margins clear. We will keep an eye out for recurrences elsewhere in his body.    ___________________________________________ Gwen Her. Dianah Field, M.D., ABFM., CAQSM. Primary Care and [place] Instructor of [place] of [facility] of Medicine

## 2021-08-25 ENCOUNTER — Encounter: Payer: Self-pay | Admitting: Sports Medicine

## 2021-08-25 ENCOUNTER — Ambulatory Visit (INDEPENDENT_AMBULATORY_CARE_PROVIDER_SITE_OTHER): Payer: BC Managed Care – PPO | Admitting: Sports Medicine

## 2021-08-25 DIAGNOSIS — I82511 Chronic embolism and thrombosis of right femoral vein: Secondary | ICD-10-CM

## 2021-08-25 DIAGNOSIS — R600 Localized edema: Secondary | ICD-10-CM

## 2021-08-25 DIAGNOSIS — R809 Proteinuria, unspecified: Secondary | ICD-10-CM | POA: Diagnosis not present

## 2021-08-25 DIAGNOSIS — I878 Other specified disorders of veins: Secondary | ICD-10-CM

## 2021-08-25 MED ORDER — FUROSEMIDE 40 MG PO TABS: 40.0000 mg | ORAL_TABLET | Freq: Every day | ORAL | 1 refills | Status: DC

## 2021-08-25 NOTE — Progress Notes (Addendum)
    Procedures performed today:    None.  Independent interpretation of notes and tests performed by another provider:   None.  Brief History, Exam, Impression, and Recommendations:    Bilateral lower extremity edema Pleasant 47 year old male, chronic bilateral lower extremity edema, he does have bilateral saphenous thromboses on ultrasound but no DVTs. Compression stockings do help but he tends to take them off. Continues with aspirin. At this point he is looking for more aggressive intervention, I would like to get another set of bilateral ultrasounds, labs including CBC, CMP, urinalysis to look for proteinuria, BNP, TSH. I would like vascular surgery involved. I did explain to him the difficulty in treating chronic venous insufficiency. Of note if he continues to have the bilateral saphenous thromboses I would consider a 51-monthcourse of Eliquis as he has not responded to aspirin/NSAIDs.  Update: There is protein noted in the urine as well as hyaline casts, renal function is normal, but considering these findings we need to do a 24-hour urine protein collection to evaluate for nephrotic syndrome.   Update 2: Left great saphenous superficial clot, chronic right femoral DVT, considering persistence of symptoms and inadequate effect from NSAIDs we will switch to Eliquis, decrease aspirin to 81 mg.  Everything will be called in.  Keep follow-up with vascular surgery.  Proteinuria There is protein noted in the urine as well as hyaline casts, renal function is normal, but considering these findings we need to do a 24-hour urine protein collection to evaluate for nephrotic syndrome.   Chronic deep vein thrombosis (DVT) of right femoral vein (HCC) Left great saphenous superficial clot, chronic right femoral DVT, considering persistence of symptoms and inadequate effect from NSAIDs we will switch to Eliquis, decrease aspirin to 81 mg.  Everything will be called in.  Keep follow-up with  vascular surgery.  Chronic process with exacerbation and pharmacologic intervention  ____________________________________________ TGwen Her TDianah Field M.D., ABFM., CAQSM., AME. Primary Care and Sports Medicine [facility] Adjunct Professor of [employer] [facility]  [employer] manager

## 2021-08-25 NOTE — Assessment & Plan Note (Addendum)
Pleasant 46 year old male, chronic bilateral lower extremity edema, he does have bilateral saphenous thromboses on ultrasound but no DVTs. Compression stockings do help but he tends to take them off. Continues with aspirin. At this point he is looking for more aggressive intervention, I would like to get another set of bilateral ultrasounds, labs including CBC, CMP, urinalysis to look for proteinuria, BNP, TSH. I would like vascular surgery involved. I did explain to him the difficulty in treating chronic venous insufficiency. Of note if he continues to have the bilateral saphenous thromboses I would consider a 51-monthcourse of Eliquis as he has not responded to aspirin/NSAIDs.  Update: There is protein noted in the urine as well as hyaline casts, renal function is normal, but considering these findings we need to do a 24-hour urine protein collection to evaluate for nephrotic syndrome.   Update 2: Left great saphenous superficial clot, chronic right femoral DVT, considering persistence of symptoms and inadequate effect from NSAIDs we will switch to Eliquis, decrease aspirin to 81 mg.  Everything will be called in.  Keep follow-up with vascular surgery.

## 2021-08-26 ENCOUNTER — Encounter: Payer: Self-pay | Admitting: Sports Medicine

## 2021-08-26 DIAGNOSIS — R809 Proteinuria, unspecified: Secondary | ICD-10-CM | POA: Insufficient documentation

## 2021-08-26 LAB — CBC
HCT: 46.2 % (ref 38.5–50.0)
Hemoglobin: 16 g/dL (ref 13.2–17.1)
MCH: 33.8 pg — ABNORMAL HIGH (ref 27.0–33.0)
MCHC: 34.6 g/dL (ref 32.0–36.0)
MCV: 97.5 fL (ref 80.0–100.0)
MPV: 11.6 fL (ref 7.5–12.5)
Platelets: 155 10*3/uL (ref 140–400)
RBC: 4.74 10*6/uL (ref 4.20–5.80)
RDW: 13.3 % (ref 11.0–15.0)
WBC: 3.9 10*3/uL (ref 3.8–10.8)

## 2021-08-26 LAB — COMPLETE METABOLIC PANEL WITH GFR
AG Ratio: 1.9 (calc) (ref 1.0–2.5)
ALT: 39 U/L (ref 9–46)
AST: 42 U/L — ABNORMAL HIGH (ref 10–40)
Albumin: 3.7 g/dL (ref 3.6–5.1)
Alkaline phosphatase (APISO): 75 U/L (ref 36–130)
BUN: 11 mg/dL (ref 7–25)
CO2: 27 mmol/L (ref 20–32)
Calcium: 9 mg/dL (ref 8.6–10.3)
Chloride: 109 mmol/L (ref 98–110)
Creat: 1.14 mg/dL (ref 0.60–1.29)
Globulin: 2 g/dL (calc) (ref 1.9–3.7)
Glucose, Bld: 92 mg/dL (ref 65–99)
Potassium: 4 mmol/L (ref 3.5–5.3)
Sodium: 142 mmol/L (ref 135–146)
Total Bilirubin: 1.2 mg/dL (ref 0.2–1.2)
Total Protein: 5.7 g/dL — ABNORMAL LOW (ref 6.1–8.1)
eGFR: 80 mL/min/{1.73_m2} (ref >=60)

## 2021-08-26 LAB — URINALYSIS W MICROSCOPIC + REFLEX CULTURE
Bacteria, UA: NONE SEEN /HPF
Bilirubin Urine: NEGATIVE
Glucose, UA: NEGATIVE
Hgb urine dipstick: NEGATIVE
Ketones, ur: NEGATIVE
Leukocyte Esterase: NEGATIVE
Nitrites, Initial: NEGATIVE
Specific Gravity, Urine: 1.033 (ref 1.001–1.035)
Squamous Epithelial / HPF: NONE SEEN /HPF (ref <=5)
WBC, UA: NONE SEEN /HPF (ref 0–5)
pH: 6 (ref 5.0–8.0)

## 2021-08-26 LAB — BRAIN NATRIURETIC PEPTIDE: Brain Natriuretic Peptide: 12 pg/mL (ref <100)

## 2021-08-26 LAB — NO CULTURE INDICATED

## 2021-08-26 NOTE — Addendum Note (Signed)
Addended by: Silverio Decamp on: 08/26/2021 08:55 AM   Modules accepted: Orders

## 2021-08-26 NOTE — Assessment & Plan Note (Signed)
There is protein noted in the urine as well as hyaline casts, renal function is normal, but considering these findings we need to do a 24-hour urine protein collection to evaluate for nephrotic syndrome.

## 2021-08-28 ENCOUNTER — Ambulatory Visit (INDEPENDENT_AMBULATORY_CARE_PROVIDER_SITE_OTHER): Payer: BC Managed Care – PPO

## 2021-08-28 DIAGNOSIS — I878 Other specified disorders of veins: Secondary | ICD-10-CM

## 2021-08-28 DIAGNOSIS — I82812 Embolism and thrombosis of superficial veins of left lower extremities: Secondary | ICD-10-CM | POA: Diagnosis not present

## 2021-08-29 ENCOUNTER — Encounter: Payer: Self-pay | Admitting: Sports Medicine

## 2021-08-29 ENCOUNTER — Other Ambulatory Visit: Payer: BC Managed Care – PPO

## 2021-08-29 DIAGNOSIS — I82511 Chronic embolism and thrombosis of right femoral vein: Secondary | ICD-10-CM

## 2021-08-29 MED ORDER — APIXABAN 5 MG PO TABS: ORAL_TABLET | ORAL | 5 refills | Status: DC

## 2021-08-29 MED ORDER — ASPIRIN 81 MG PO TBEC: 81.0000 mg | DELAYED_RELEASE_TABLET | Freq: Every day | ORAL | 3 refills | Status: DC

## 2021-08-29 NOTE — Assessment & Plan Note (Signed)
Left great saphenous superficial clot, chronic right femoral DVT, considering persistence of symptoms and inadequate effect from NSAIDs we will switch to Eliquis, decrease aspirin to 81 mg.  Everything will be called in.  Keep follow-up with vascular surgery.

## 2021-08-29 NOTE — Addendum Note (Signed)
Addended by: Silverio Decamp on: 08/29/2021 08:26 AM   Modules accepted: Orders

## 2021-09-09 ENCOUNTER — Encounter: Payer: BC Managed Care – PPO | Admitting: Family Medicine

## 2022-02-11 DIAGNOSIS — J069 Acute upper respiratory infection, unspecified: Secondary | ICD-10-CM | POA: Diagnosis not present

## 2022-02-11 DIAGNOSIS — Z20822 Contact with and (suspected) exposure to covid-19: Secondary | ICD-10-CM | POA: Diagnosis not present

## 2022-05-11 ENCOUNTER — Ambulatory Visit (INDEPENDENT_AMBULATORY_CARE_PROVIDER_SITE_OTHER): Payer: BC Managed Care – PPO | Admitting: Family Medicine

## 2022-05-11 ENCOUNTER — Encounter: Payer: Self-pay | Admitting: Family Medicine

## 2022-05-11 VITALS — BP 137/72 | HR 74 | Ht 71.0 in | Wt 221.0 lb

## 2022-05-11 DIAGNOSIS — Z13220 Encounter for screening for lipoid disorders: Secondary | ICD-10-CM

## 2022-05-11 DIAGNOSIS — I82511 Chronic embolism and thrombosis of right femoral vein: Secondary | ICD-10-CM

## 2022-05-11 DIAGNOSIS — Z0000 Encounter for general adult medical examination without abnormal findings: Secondary | ICD-10-CM | POA: Diagnosis not present

## 2022-05-11 NOTE — Assessment & Plan Note (Signed)
Well adult Orders Placed This Encounter  Procedures   COMPLETE METABOLIC PANEL WITH GFR   Lipid Panel w/reflex Direct LDL   CBC with Differential   Ambulatory referral to Vascular Surgery    Referral Priority:   Routine    Referral Type:   Surgical    Referral Reason:   Specialty Services Required    Requested Specialty:   Vascular Surgery    Number of Visits Requested:   1  Screenings per lab orders Immunizations: Declines Anticipatory guidance/risk factor reduction: Recommendations per AVS.

## 2022-05-11 NOTE — Patient Instructions (Signed)

## 2022-05-11 NOTE — Progress Notes (Signed)
Travis Gay - 47 y.o. male MRN [id number]  Date of birth: March 04, 1975  Subjective Chief Complaint  Patient presents with   Annual Exam    HPI Travis Gay is a 47 y.o. male here today for annual exam.  Reports he is doing pretty well.  Continues to have swelling in the right lower extremity.  Has been referred to vascular surgery previously but reports that appointment Getting rescheduled on him.  He would like a new referral for this.    Is moderately active.  Feels like diet could be a little better.  Is a non-smoker.  Occasional alcohol use.  Up-to-date on colon cancer screening  Review of Systems  Constitutional:  Negative for chills, fever, malaise/fatigue and weight loss.  HENT:  Negative for congestion, ear pain and sore throat.   Eyes:  Negative for blurred vision, double vision and pain.  Respiratory:  Negative for cough and shortness of breath.   Cardiovascular:  Negative for chest pain and palpitations.  Gastrointestinal:  Negative for abdominal pain, blood in stool, constipation, heartburn and nausea.  Genitourinary:  Negative for dysuria and urgency.  Musculoskeletal:  Negative for joint pain and myalgias.  Neurological:  Negative for dizziness and headaches.  Endo/Heme/Allergies:  Does not bruise/bleed easily.  Psychiatric/Behavioral:  Negative for depression. The patient is not nervous/anxious and does not have insomnia.     No Known Allergies  Past Medical History:  Diagnosis Date   Heart murmur     Past Surgical History:  Procedure Laterality Date   FRACTURE SURGERY Left     Social History   Socioeconomic History   Marital status: Single    Spouse name: Not on file   Number of children: Not on file   Years of education: Not on file   Highest education level: Not on file  Occupational History   Not on file  Tobacco Use   Smoking status: Never   Smokeless tobacco: Never  Vaping Use   Vaping Use: Never used  Substance and Sexual Activity    Alcohol use: Yes    Alcohol/week: 2.0 standard drinks of alcohol    Types: 2 Standard drinks or equivalent per week   Drug use: Never   Sexual activity: Yes    Birth control/protection: Condom  Other Topics Concern   Not on file  Social History Narrative   Not on file   Social Determinants of Health   Financial Resource Strain: Not on file  Food Insecurity: Not on file  Transportation Needs: Not on file  Physical Activity: Not on file  Stress: Not on file  Social Connections: Not on file    Family History  Problem Relation Age of Onset   Deep vein thrombosis Mother    Heart disease Mother    Stroke Mother    Cancer Father     Health Maintenance  Topic Date Due   COLONOSCOPY (Pts 45-33yrs Insurance coverage will need to be confirmed)  08/26/2022 (Originally 07/12/2020)   Hepatitis C Screening  08/26/2022 (Originally 07/12/1993)   HIV Screening  08/26/2022 (Originally 07/13/1990)   COVID-19 Vaccine (3 - Pfizer risk series) 11/11/2022 (Originally 10/19/2020)   DTaP/Tdap/Td (2 - Td or Tdap) 05/21/2027   HPV VACCINES  Aged Out   INFLUENZA VACCINE  Discontinued     ----------------------------------------------------------------------------------------------------------------------------------------------------------------------------------------------------------------- Physical Exam BP 137/72 (BP Location: Left Arm, Patient Position: Sitting, Cuff Size: Large)   Pulse 74   Ht 5\' 11"  (1.803 m)   Wt 221 lb (100.2 kg)  SpO2 99%   BMI 30.82 kg/m   Physical Exam Constitutional:      General: He is not in acute distress. HENT:     Head: Normocephalic and atraumatic.     Right Ear: Tympanic membrane and external ear normal.     Left Ear: Tympanic membrane and external ear normal.  Eyes:     General: No scleral icterus. Neck:     Thyroid: No thyromegaly.  Cardiovascular:     Rate and Rhythm: Normal rate and regular rhythm.     Heart sounds: Normal heart sounds.   Pulmonary:     Effort: Pulmonary effort is normal.     Breath sounds: Normal breath sounds.  Abdominal:     General: Bowel sounds are normal. There is no distension.     Palpations: Abdomen is soft.     Tenderness: There is no abdominal tenderness. There is no guarding.  Musculoskeletal:     Cervical back: Normal range of motion.  Lymphadenopathy:     Cervical: No cervical adenopathy.  Skin:    General: Skin is warm and dry.     Findings: No rash.  Neurological:     Mental Status: He is alert and oriented to person, place, and time.     Cranial Nerves: No cranial nerve deficit.     Motor: No abnormal muscle tone.  Psychiatric:        Mood and Affect: Mood normal.        Behavior: Behavior normal.     ------------------------------------------------------------------------------------------------------------------------------------------------------------------------------------------------------------------- Assessment and Plan  Well adult exam Well adult Orders Placed This Encounter  Procedures   COMPLETE METABOLIC PANEL WITH GFR   Lipid Panel w/reflex Direct LDL   CBC with Differential   Ambulatory referral to Vascular Surgery    Referral Priority:   Routine    Referral Type:   Surgical    Referral Reason:   Specialty Services Required    Requested Specialty:   Vascular Surgery    Number of Visits Requested:   1  Screenings per lab orders Immunizations: Declines Anticipatory guidance/risk factor reduction: Recommendations per AVS.     No orders of the defined types were placed in this encounter.   No follow-ups on file.    This visit occurred during the SARS-CoV-2 public health emergency.  Safety protocols were in place, including screening questions prior to the visit, additional usage of staff PPE, and extensive cleaning of exam room while observing appropriate contact time as indicated for disinfecting solutions.

## 2022-05-22 DIAGNOSIS — R197 Diarrhea, unspecified: Secondary | ICD-10-CM | POA: Diagnosis not present

## 2022-05-22 DIAGNOSIS — R9431 Abnormal electrocardiogram [ECG] [EKG]: Secondary | ICD-10-CM | POA: Diagnosis not present

## 2022-05-22 DIAGNOSIS — R110 Nausea: Secondary | ICD-10-CM | POA: Diagnosis not present

## 2022-05-22 DIAGNOSIS — R1111 Vomiting without nausea: Secondary | ICD-10-CM | POA: Diagnosis not present

## 2022-05-22 DIAGNOSIS — Z87891 Personal history of nicotine dependence: Secondary | ICD-10-CM | POA: Diagnosis not present

## 2022-05-22 DIAGNOSIS — R112 Nausea with vomiting, unspecified: Secondary | ICD-10-CM | POA: Diagnosis not present

## 2022-05-22 DIAGNOSIS — R1013 Epigastric pain: Secondary | ICD-10-CM | POA: Diagnosis not present

## 2022-05-22 DIAGNOSIS — R000 Tachycardia, unspecified: Secondary | ICD-10-CM | POA: Diagnosis not present

## 2022-06-04 ENCOUNTER — Ambulatory Visit: Payer: BC Managed Care – PPO | Admitting: Family Medicine

## 2022-06-10 ENCOUNTER — Ambulatory Visit: Payer: BC Managed Care – PPO | Admitting: Family Medicine

## 2022-06-11 ENCOUNTER — Ambulatory Visit: Payer: BC Managed Care – PPO | Admitting: Family Medicine

## 2022-06-16 ENCOUNTER — Encounter: Payer: Self-pay | Admitting: Family Medicine

## 2022-06-26 ENCOUNTER — Encounter: Payer: Self-pay | Admitting: Family Medicine

## 2022-06-30 DIAGNOSIS — Z1331 Encounter for screening for depression: Secondary | ICD-10-CM | POA: Diagnosis not present

## 2022-06-30 DIAGNOSIS — M7989 Other specified soft tissue disorders: Secondary | ICD-10-CM | POA: Diagnosis not present

## 2022-06-30 DIAGNOSIS — I872 Venous insufficiency (chronic) (peripheral): Secondary | ICD-10-CM | POA: Diagnosis not present

## 2022-06-30 DIAGNOSIS — I82511 Chronic embolism and thrombosis of right femoral vein: Secondary | ICD-10-CM | POA: Diagnosis not present

## 2022-06-30 NOTE — Telephone Encounter (Signed)
Letter should be visible in MyChart.   Thanks!  CM

## 2022-07-10 ENCOUNTER — Encounter: Payer: Self-pay | Admitting: Family Medicine

## 2022-08-03 DIAGNOSIS — I82511 Chronic embolism and thrombosis of right femoral vein: Secondary | ICD-10-CM | POA: Diagnosis not present

## 2022-08-03 DIAGNOSIS — M7989 Other specified soft tissue disorders: Secondary | ICD-10-CM | POA: Diagnosis not present

## 2022-08-03 DIAGNOSIS — I872 Venous insufficiency (chronic) (peripheral): Secondary | ICD-10-CM | POA: Diagnosis not present

## 2022-10-06 ENCOUNTER — Ambulatory Visit (INDEPENDENT_AMBULATORY_CARE_PROVIDER_SITE_OTHER): Payer: BC Managed Care – PPO | Admitting: Family Medicine

## 2022-10-06 ENCOUNTER — Ambulatory Visit (INDEPENDENT_AMBULATORY_CARE_PROVIDER_SITE_OTHER): Payer: BC Managed Care – PPO

## 2022-10-06 ENCOUNTER — Encounter: Payer: Self-pay | Admitting: Family Medicine

## 2022-10-06 VITALS — BP 120/77 | HR 65 | Ht 71.0 in | Wt 222.8 lb

## 2022-10-06 DIAGNOSIS — M79672 Pain in left foot: Secondary | ICD-10-CM

## 2022-10-06 DIAGNOSIS — H6993 Unspecified Eustachian tube disorder, bilateral: Secondary | ICD-10-CM | POA: Diagnosis not present

## 2022-10-06 DIAGNOSIS — M7732 Calcaneal spur, left foot: Secondary | ICD-10-CM | POA: Diagnosis not present

## 2022-10-06 MED ORDER — MELOXICAM 15 MG PO TABS: 15.0000 mg | ORAL_TABLET | Freq: Every day | ORAL | 0 refills | Status: DC

## 2022-10-06 NOTE — Patient Instructions (Signed)
Try meloxicam daily as needed for foot pain.   Start flonase daily for recurrent ear pressure.  You can use saline rinse throughout the day.    Eustachian Tube Dysfunction  Eustachian tube dysfunction refers to a condition in which a blockage develops in the narrow passage that connects the middle ear to the back of the nose (eustachian tube). The eustachian tube regulates air pressure in the middle ear by letting air move between the ear and nose. It also helps to drain fluid from the middle ear space. Eustachian tube dysfunction can affect one or both ears. When the eustachian tube does not function properly, air pressure, fluid, or both can build up in the middle ear. What are the causes? This condition occurs when the eustachian tube becomes blocked or cannot open normally. Common causes of this condition include: Ear infections. Colds and other infections that affect the nose, mouth, and throat (upper respiratory tract). Allergies. Irritation from cigarette smoke. Irritation from stomach acid coming up into the esophagus (gastroesophageal reflux). The esophagus is the part of the body that moves food from the mouth to the stomach. Sudden changes in air pressure, such as from descending in an airplane or scuba diving. Abnormal growths in the nose or throat, such as: Growths that line the nose (nasal polyps). Abnormal growth of cells (tumors). Enlarged tissue at the back of the throat (adenoids). What increases the risk? You are more likely to develop this condition if: You smoke. You are overweight. You are a child who has: Certain birth defects of the mouth, such as cleft palate. Large tonsils or adenoids. What are the signs or symptoms? Common symptoms of this condition include: A feeling of fullness in the ear. Ear pain. Clicking or popping noises in the ear. Ringing in the ear (tinnitus). Hearing loss. Loss of balance. Dizziness. Symptoms may get worse when the air  pressure around you changes, such as when you travel to an area of high elevation, fly on an airplane, or go scuba diving. How is this diagnosed? This condition may be diagnosed based on: Your symptoms. A physical exam of your ears, nose, and throat. Tests, such as those that measure: The movement of your eardrum. Your hearing (audiometry). How is this treated? Treatment depends on the cause and severity of your condition. In mild cases, you may relieve your symptoms by moving air into your ears. This is called "popping the ears." In more severe cases, or if you have symptoms of fluid in your ears, treatment may include: Medicines to relieve congestion (decongestants). Medicines that treat allergies (antihistamines). Nasal sprays or ear drops that contain medicines that reduce swelling (steroids). A procedure to drain the fluid in your eardrum. In this procedure, a small tube may be placed in the eardrum to: Drain the fluid. Restore the air in the middle ear space. A procedure to insert a balloon device through the nose to inflate the opening of the eustachian tube (balloon dilation). Follow these instructions at home: Lifestyle Do not do any of the following until your health care provider approves: Travel to high altitudes. Fly in airplanes. Work in a Estate agent or room. Scuba dive. Do not use any products that contain nicotine or tobacco. These products include cigarettes, chewing tobacco, and vaping devices, such as e-cigarettes. If you need help quitting, ask your health care provider. Keep your ears dry. Wear fitted earplugs during showering and bathing. Dry your ears completely after. General instructions Take over-the-counter and prescription medicines only as  told by your health care provider. Use techniques to help pop your ears as recommended by your health care provider. These may include: Chewing gum. Yawning. Frequent, forceful swallowing. Closing your mouth,  holding your nose closed, and gently blowing as if you are trying to blow air out of your nose. Keep all follow-up visits. This is important. Contact a health care provider if: Your symptoms do not go away after treatment. Your symptoms come back after treatment. You are unable to pop your ears. You have: A fever. Pain in your ear. Pain in your head or neck. Fluid draining from your ear. Your hearing suddenly changes. You become very dizzy. You lose your balance. Get help right away if: You have a sudden, severe increase in any of your symptoms. Summary Eustachian tube dysfunction refers to a condition in which a blockage develops in the eustachian tube. It can be caused by ear infections, allergies, inhaled irritants, or abnormal growths in the nose or throat. Symptoms may include ear pain or fullness, hearing loss, or ringing in the ears. Mild cases are treated with techniques to unblock the ears, such as yawning or chewing gum. More severe cases are treated with medicines or procedures. This information is not intended to replace advice given to you by your health care provider. Make sure you discuss any questions you have with your health care provider. Document Revised: 04/15/2020 Document Reviewed: 04/15/2020 Elsevier Patient Education  2024 [employer].

## 2022-10-09 ENCOUNTER — Encounter: Payer: Self-pay | Admitting: Family Medicine

## 2022-10-09 DIAGNOSIS — M7730 Calcaneal spur, unspecified foot: Secondary | ICD-10-CM

## 2022-10-09 DIAGNOSIS — R7989 Other specified abnormal findings of blood chemistry: Secondary | ICD-10-CM

## 2022-10-09 DIAGNOSIS — Z0000 Encounter for general adult medical examination without abnormal findings: Secondary | ICD-10-CM

## 2022-10-09 DIAGNOSIS — Z13220 Encounter for screening for lipoid disorders: Secondary | ICD-10-CM

## 2022-10-09 DIAGNOSIS — M79673 Pain in unspecified foot: Secondary | ICD-10-CM

## 2022-10-09 DIAGNOSIS — Z1159 Encounter for screening for other viral diseases: Secondary | ICD-10-CM

## 2022-10-11 ENCOUNTER — Encounter: Payer: Self-pay | Admitting: Family Medicine

## 2022-10-11 DIAGNOSIS — M79672 Pain in left foot: Secondary | ICD-10-CM | POA: Insufficient documentation

## 2022-10-11 DIAGNOSIS — H6990 Unspecified Eustachian tube disorder, unspecified ear: Secondary | ICD-10-CM | POA: Insufficient documentation

## 2022-10-11 NOTE — Assessment & Plan Note (Signed)
Ears are normal today however has had symptoms consistent with eustachian tube dysfunction as well as some postnasal drainage.  Recommend addition of Flonase daily.

## 2022-10-11 NOTE — Progress Notes (Signed)
Travis Gay - 47 y.o. male MRN [id number]  Date of birth: 03/24/1975  Subjective Chief Complaint  Patient presents with   Foot Pain    Patient c/o  left foot heel pain x several months - varies in intensity. Patient has a history of plantar fascitis.    throat and ear pain    C/o occasional right ear pain feels stopped up with muffled sounds ( not today  per pt )     HPI Travis Gay is a 47 y.o. male here today with complaint of scratchy throat with ear pressure.  He has had this for several days.  It does seem improved today.  He has not really tried anything so far.  Denies fever, chills, cough, wheezing.  He has had some chronic foot pain on the left heel.  He has had this for several months.  Pain waxes and wanes.  He is on his feet quite a bit for work.  Denies swelling.  He has tried using new inserts for his shoes.  ROS:  A comprehensive ROS was completed and negative except as noted per HPI    No Known Allergies  Past Medical History:  Diagnosis Date   Heart murmur     Past Surgical History:  Procedure Laterality Date   FRACTURE SURGERY Left     Social History   Socioeconomic History   Marital status: Single    Spouse name: Not on file   Number of children: Not on file   Years of education: Not on file   Highest education level: Not on file  Occupational History   Not on file  Tobacco Use   Smoking status: Never   Smokeless tobacco: Never  Vaping Use   Vaping status: Never Used  Substance and Sexual Activity   Alcohol use: Yes    Alcohol/week: 2.0 standard drinks of alcohol    Types: 2 Standard drinks or equivalent per week   Drug use: Never   Sexual activity: Yes    Birth control/protection: Condom  Other Topics Concern   Not on file  Social History Narrative   Not on file   Social Determinants of Health   Financial Resource Strain: Low Risk  (06/30/2022)   Received from [facility], [facility]   Overall Financial Resource Strain  (CARDIA)    Difficulty of Paying Living Expenses: Not very hard  Food Insecurity: Food Insecurity Present (06/30/2022)   Received from [facility], [facility]   Hunger Vital Sign    Worried About Running Out of Food in the Last Year: Never true    Ran Out of Food in the Last Year: Often true  Transportation Needs: Unmet Transportation Needs (06/30/2022)   Received from [employer], [facility]   PRAPARE - Transportation    Lack of Transportation (Medical): Yes    Lack of Transportation (Non-Medical): No  Physical Activity: Sufficiently Active (06/30/2022)   Received from [facility], [facility]   Exercise Vital Sign    Days of Exercise per Week: 4 days    Minutes of Exercise per Session: 40 min  Stress: No Stress Concern Present (06/30/2022)   Received from [facility], [facility] of Occupational Health - Occupational Stress Questionnaire    Feeling of Stress : Not at all  Social Connections: Moderately Integrated (06/30/2022)   Received from [facility], [facility]   Social Network    How would you rate your social network (family, work, friends)?:  Adequate participation with social networks    Family History  Problem Relation Age of Onset   Deep vein thrombosis Mother    Heart disease Mother    Stroke Mother    Cancer Father     Health Maintenance  Topic Date Due   HIV Screening  Never done   Hepatitis C Screening  Never done   COVID-19 Vaccine (3 - Pfizer risk series) 11/11/2022 (Originally 10/19/2020)   DTaP/Tdap/Td (2 - Td or Tdap) 05/21/2027   Colonoscopy  02/28/2031   HPV VACCINES  Aged Out   INFLUENZA VACCINE  Discontinued     ----------------------------------------------------------------------------------------------------------------------------------------------------------------------------------------------------------------- Physical Exam BP 120/77   Pulse 65   Ht 5\' 11"  (1.803 m)   Wt 222 lb 12 oz (101 kg)    SpO2 97%   BMI 31.07 kg/m   Physical Exam Constitutional:      Appearance: Normal appearance.  HENT:     Head: Normocephalic and atraumatic.  Eyes:     General: No scleral icterus. Cardiovascular:     Rate and Rhythm: Normal rate and regular rhythm.  Pulmonary:     Effort: Pulmonary effort is normal.     Breath sounds: Normal breath sounds.  Neurological:     Mental Status: He is alert.  Psychiatric:        Mood and Affect: Mood normal.        Behavior: Behavior normal.     ------------------------------------------------------------------------------------------------------------------------------------------------------------------------------------------------------------------- Assessment and Plan  Pain of left heel X-rays of the left foot ordered.  Adding meloxicam as needed.  Recommend addition of gel heel cup.  Icing daily.  Eustachian tube dysfunction Ears are normal today however has had symptoms consistent with eustachian tube dysfunction as well as some postnasal drainage.  Recommend addition of Flonase daily.   Meds ordered this encounter  Medications   meloxicam (MOBIC) 15 MG tablet    Sig: Take 1 tablet (15 mg total) by mouth daily.    Dispense:  30 tablet    Refill:  0    No follow-ups on file.    This visit occurred during the SARS-CoV-2 public health emergency.  Safety protocols were in place, including screening questions prior to the visit, additional usage of staff PPE, and extensive cleaning of exam room while observing appropriate contact time as indicated for disinfecting solutions.

## 2022-10-11 NOTE — Assessment & Plan Note (Signed)
X-rays of the left foot ordered.  Adding meloxicam as needed.  Recommend addition of gel heel cup.  Icing daily.

## 2022-10-15 ENCOUNTER — Other Ambulatory Visit: Payer: Self-pay | Admitting: Oncology

## 2022-10-15 ENCOUNTER — Other Ambulatory Visit: Payer: Self-pay | Admitting: Family Medicine

## 2022-10-15 DIAGNOSIS — Z006 Encounter for examination for normal comparison and control in clinical research program: Secondary | ICD-10-CM

## 2022-10-15 DIAGNOSIS — M79673 Pain in unspecified foot: Secondary | ICD-10-CM

## 2022-10-21 DIAGNOSIS — I872 Venous insufficiency (chronic) (peripheral): Secondary | ICD-10-CM | POA: Diagnosis not present

## 2022-10-23 DIAGNOSIS — Z0000 Encounter for general adult medical examination without abnormal findings: Secondary | ICD-10-CM | POA: Diagnosis not present

## 2022-10-23 DIAGNOSIS — Z1159 Encounter for screening for other viral diseases: Secondary | ICD-10-CM | POA: Diagnosis not present

## 2022-10-23 DIAGNOSIS — Z13220 Encounter for screening for lipoid disorders: Secondary | ICD-10-CM | POA: Diagnosis not present

## 2022-10-24 LAB — CBC WITH DIFFERENTIAL/PLATELET
Basophils Absolute: 0 10*3/uL (ref 0.0–0.2)
Basos: 1 %
EOS (ABSOLUTE): 0.1 10*3/uL (ref 0.0–0.4)
Eos: 4 %
Hematocrit: 46 % (ref 37.5–51.0)
Hemoglobin: 15.7 g/dL (ref 13.0–17.7)
Immature Grans (Abs): 0 10*3/uL (ref 0.0–0.1)
Immature Granulocytes: 1 %
Lymphocytes Absolute: 1.6 10*3/uL (ref 0.7–3.1)
Lymphs: 41 %
MCH: 32.9 pg (ref 26.6–33.0)
MCHC: 34.1 g/dL (ref 31.5–35.7)
MCV: 96 fL (ref 79–97)
Monocytes Absolute: 0.4 10*3/uL (ref 0.1–0.9)
Monocytes: 12 %
Neutrophils Absolute: 1.5 10*3/uL (ref 1.4–7.0)
Neutrophils: 41 %
Platelets: 163 10*3/uL (ref 150–450)
RBC: 4.77 x10E6/uL (ref 4.14–5.80)
RDW: 13.5 % (ref 11.6–15.4)
WBC: 3.8 10*3/uL (ref 3.4–10.8)

## 2022-10-24 LAB — CMP14+EGFR
ALT: 40 IU/L (ref 0–44)
AST: 47 IU/L — ABNORMAL HIGH (ref 0–40)
Albumin: 3.8 g/dL — ABNORMAL LOW (ref 4.1–5.1)
Alkaline Phosphatase: 81 IU/L (ref 44–121)
BUN/Creatinine Ratio: 10 (ref 9–20)
BUN: 10 mg/dL (ref 6–24)
Bilirubin Total: 2.1 mg/dL — ABNORMAL HIGH (ref 0.0–1.2)
CO2: 22 mmol/L (ref 20–29)
Calcium: 8.9 mg/dL (ref 8.7–10.2)
Chloride: 106 mmol/L (ref 96–106)
Creatinine, Ser: 1 mg/dL (ref 0.76–1.27)
Globulin, Total: 2 g/dL (ref 1.5–4.5)
Glucose: 76 mg/dL (ref 70–99)
Potassium: 4.1 mmol/L (ref 3.5–5.2)
Sodium: 141 mmol/L (ref 134–144)
Total Protein: 5.8 g/dL — ABNORMAL LOW (ref 6.0–8.5)
eGFR: 93 mL/min/{1.73_m2} (ref >59)

## 2022-10-24 LAB — LIPID PANEL WITH LDL/HDL RATIO
Cholesterol, Total: 177 mg/dL (ref 100–199)
HDL: 47 mg/dL (ref >39)
LDL Chol Calc (NIH): 111 mg/dL — ABNORMAL HIGH (ref 0–99)
LDL/HDL Ratio: 2.4 ratio (ref 0.0–3.6)
Triglycerides: 103 mg/dL (ref 0–149)
VLDL Cholesterol Cal: 19 mg/dL (ref 5–40)

## 2022-10-24 LAB — TSH: TSH: 1.62 u[IU]/mL (ref 0.450–4.500)

## 2022-10-24 LAB — HEPATITIS C ANTIBODY: Hep C Virus Ab: NONREACTIVE

## 2022-11-06 NOTE — Telephone Encounter (Signed)
I don't think we need to check all of his labs again.  Mainly just his liver enzymes.  Orders entered.   CM

## 2022-11-06 NOTE — Addendum Note (Signed)
Addended by: Mammie Lorenzo on: 11/06/2022 12:45 PM   Modules accepted: Orders

## 2022-11-30 NOTE — Telephone Encounter (Signed)
Recommend podiatry referral.  Orders entered.  Try gel heel cups that he can get OTC.

## 2022-11-30 NOTE — Addendum Note (Signed)
Addended by: Everrett Coombe E on: 11/30/2022 08:00 AM   Modules accepted: Orders

## 2022-11-30 NOTE — Addendum Note (Signed)
Addended by: Mammie Lorenzo on: 11/30/2022 12:03 PM   Modules accepted: Orders

## 2022-12-03 ENCOUNTER — Other Ambulatory Visit: Payer: Self-pay | Admitting: Podiatry

## 2022-12-03 ENCOUNTER — Ambulatory Visit: Payer: BC Managed Care – PPO | Admitting: Podiatry

## 2022-12-03 ENCOUNTER — Encounter: Payer: Self-pay | Admitting: Podiatry

## 2022-12-03 ENCOUNTER — Ambulatory Visit: Payer: BC Managed Care – PPO

## 2022-12-03 DIAGNOSIS — M79672 Pain in left foot: Secondary | ICD-10-CM

## 2022-12-03 DIAGNOSIS — M722 Plantar fascial fibromatosis: Secondary | ICD-10-CM

## 2022-12-03 MED ORDER — MELOXICAM 15 MG PO TABS: 15.0000 mg | ORAL_TABLET | Freq: Every day | ORAL | 0 refills | Status: AC

## 2022-12-03 MED ORDER — TRIAMCINOLONE ACETONIDE 10 MG/ML IJ SUSP
2.5000 mg | Freq: Once | INTRAMUSCULAR | Status: AC
Start: 2022-12-03 — End: 2022-12-03
  Administered 2022-12-03: 2.5 mg via INTRA_ARTICULAR

## 2022-12-03 MED ORDER — DEXAMETHASONE SODIUM PHOSPHATE 120 MG/30ML IJ SOLN
4.0000 mg | Freq: Once | INTRAMUSCULAR | Status: AC
Start: 2022-12-03 — End: 2022-12-03
  Administered 2022-12-03: 4 mg via INTRA_ARTICULAR

## 2022-12-03 NOTE — Progress Notes (Signed)
  Subjective:  Patient ID: Travis Gay, male    DOB: [birth year],   MRN: [id number]  No chief complaint on file.   47 y.o. male presents for concern of left heel pain that has been ongoing for several months. He has been seen by Dr. Ashley Royalty and tried meloxicam and gel cups that helped for a while but recently has not been doing as much. Works on his feet and relates first steps after rest are difficult.  . Denies any other pedal complaints. Denies n/v/f/c.   Past Medical History:  Diagnosis Date   Heart murmur     Objective:  Physical Exam: Vascular: DP/PT pulses 2/4 bilateral. CFT <3 seconds. Normal hair growth on digits. No edema.  Skin. No lacerations or abrasions bilateral feet.  Musculoskeletal: MMT 5/5 bilateral lower extremities in DF, PF, Inversion and Eversion. Deceased ROM in DF of ankle joint. Tender to the medial calcaneal tubercle left . No pain with achilles, PT or arch. No pain with calcaneal squeeze.  Neurological: Sensation intact to light touch.   Assessment:   1. Plantar fasciitis of left foot      Plan:  Patient was evaluated and treated and all questions answered. Discussed plantar fasciitis with patient.  X-rays reviewed and discussed with patient. No acute fractures or dislocations noted. Mild spurring noted at inferior calcaneus. No coalition noted.  Reviewed notes from PCP.  Discussed treatment options including, ice, NSAIDS, supportive shoes, bracing, and stretching. Stretching exercises provided to be done on a daily basis.   Prescription for meloxicam provided and sent to pharmacy. Patient requesting injection today. Procedure note below.   Follow-up 6 weeks or sooner if any problems arise. In the meantime, encouraged to call the office with any questions, concerns, change in symptoms.   Procedure:  Discussed etiology, pathology, conservative vs. surgical therapies. At this time a plantar fascial injection was recommended.  The patient agreed and a  sterile skin prep was applied.  An injection consisting of  1cc dexamethasone 0.5 cc kenalog and 1cc marcaine mixture was infiltrated at the point of maximal tenderness on the left Heel.  Bandaid applied. The patient tolerated this well and was given instructions for aftercare.    Louann Sjogren, DPM

## 2022-12-03 NOTE — Progress Notes (Unsigned)
x

## 2022-12-03 NOTE — Patient Instructions (Signed)

## 2022-12-15 ENCOUNTER — Encounter: Payer: Self-pay | Admitting: Podiatry

## 2022-12-16 ENCOUNTER — Other Ambulatory Visit: Payer: Self-pay | Admitting: Podiatry

## 2022-12-16 MED ORDER — METHYLPREDNISOLONE 4 MG PO TBPK: ORAL_TABLET | ORAL | 0 refills | Status: AC

## 2023-01-21 ENCOUNTER — Ambulatory Visit: Payer: BC Managed Care – PPO | Admitting: Podiatry

## 2023-01-27 DIAGNOSIS — I872 Venous insufficiency (chronic) (peripheral): Secondary | ICD-10-CM | POA: Diagnosis not present

## 2023-01-27 DIAGNOSIS — I83811 Varicose veins of right lower extremities with pain: Secondary | ICD-10-CM | POA: Diagnosis not present

## 2023-02-05 ENCOUNTER — Encounter: Payer: Self-pay | Admitting: Podiatry

## 2023-03-02 DIAGNOSIS — I82511 Chronic embolism and thrombosis of right femoral vein: Secondary | ICD-10-CM | POA: Diagnosis not present

## 2023-03-02 DIAGNOSIS — Z1331 Encounter for screening for depression: Secondary | ICD-10-CM | POA: Diagnosis not present

## 2023-03-02 DIAGNOSIS — I872 Venous insufficiency (chronic) (peripheral): Secondary | ICD-10-CM | POA: Diagnosis not present

## 2023-03-02 DIAGNOSIS — M7989 Other specified soft tissue disorders: Secondary | ICD-10-CM | POA: Diagnosis not present

## 2023-04-01 IMAGING — DX DG FOOT COMPLETE 3+V*L*
3 series · 3 of 3 positions shown · non-contrast
Comparison: None.
COMPARISON: None.

Addendum:
CLINICAL DATA: Status post fall.  Old left ankle 5 days ago.

EXAM:
LEFT FOOT - COMPLETE 3+ VIEW; LEFT ANKLE COMPLETE - 3+ VIEW

[foot ap]
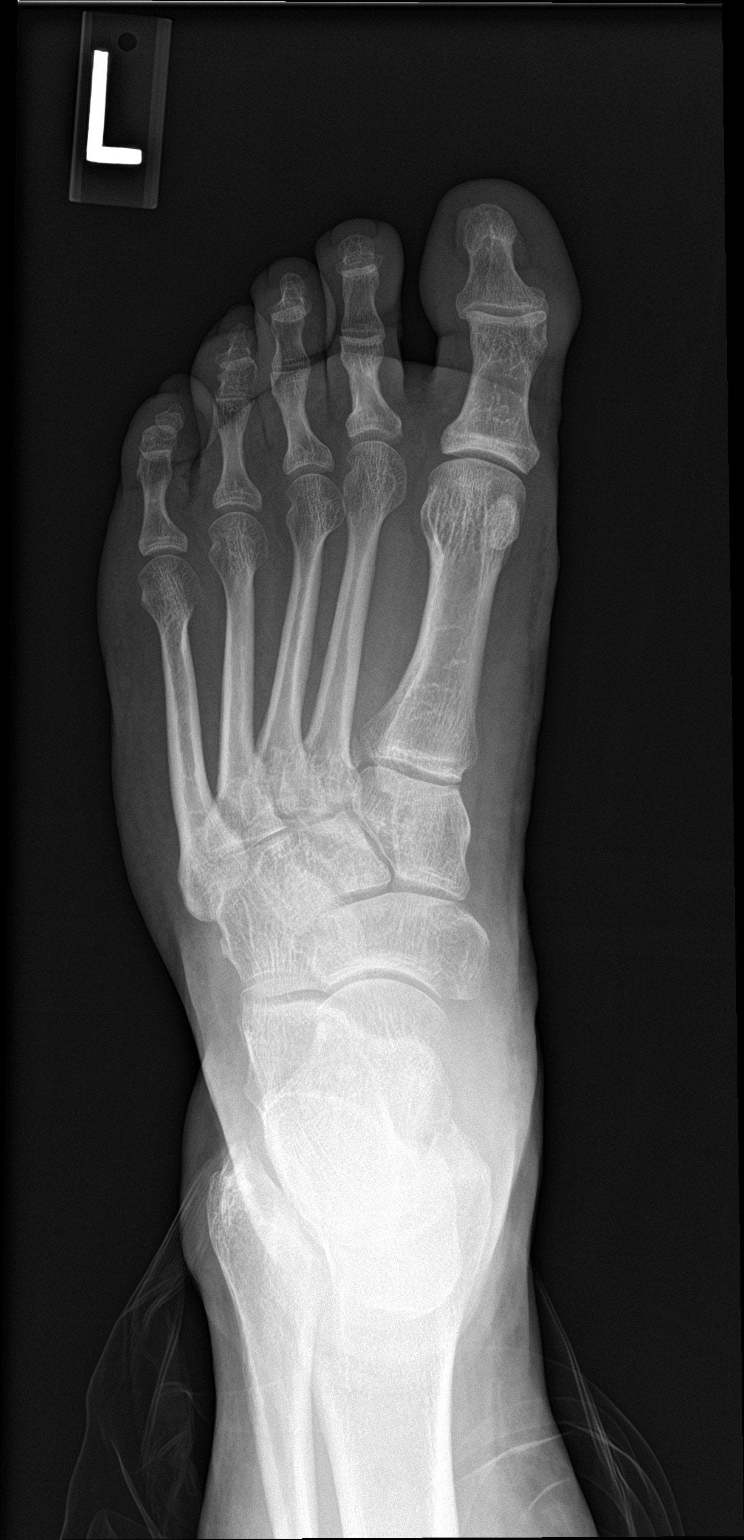

[foot obl]
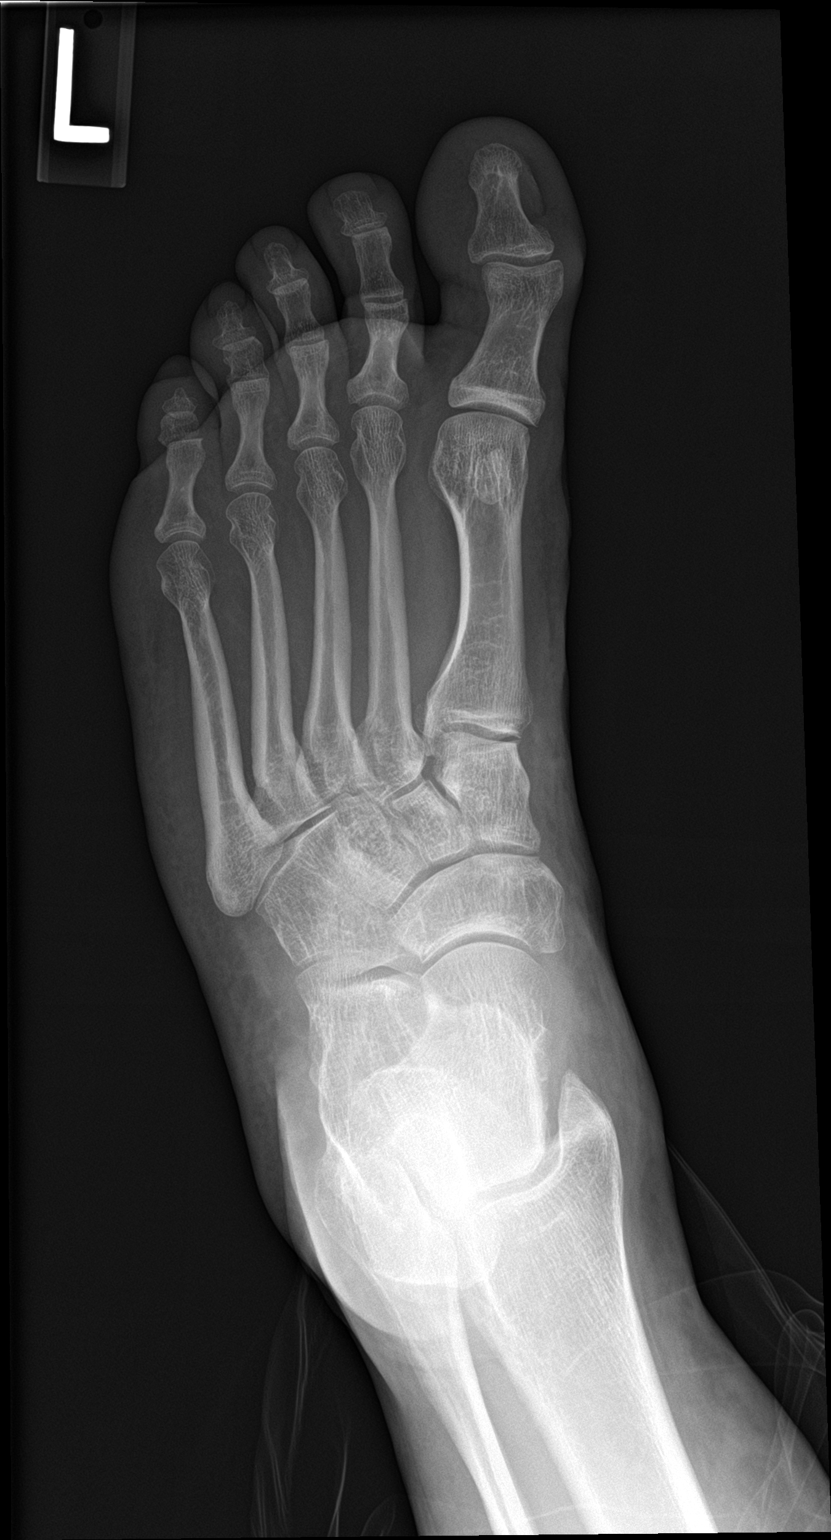

[foot lat]
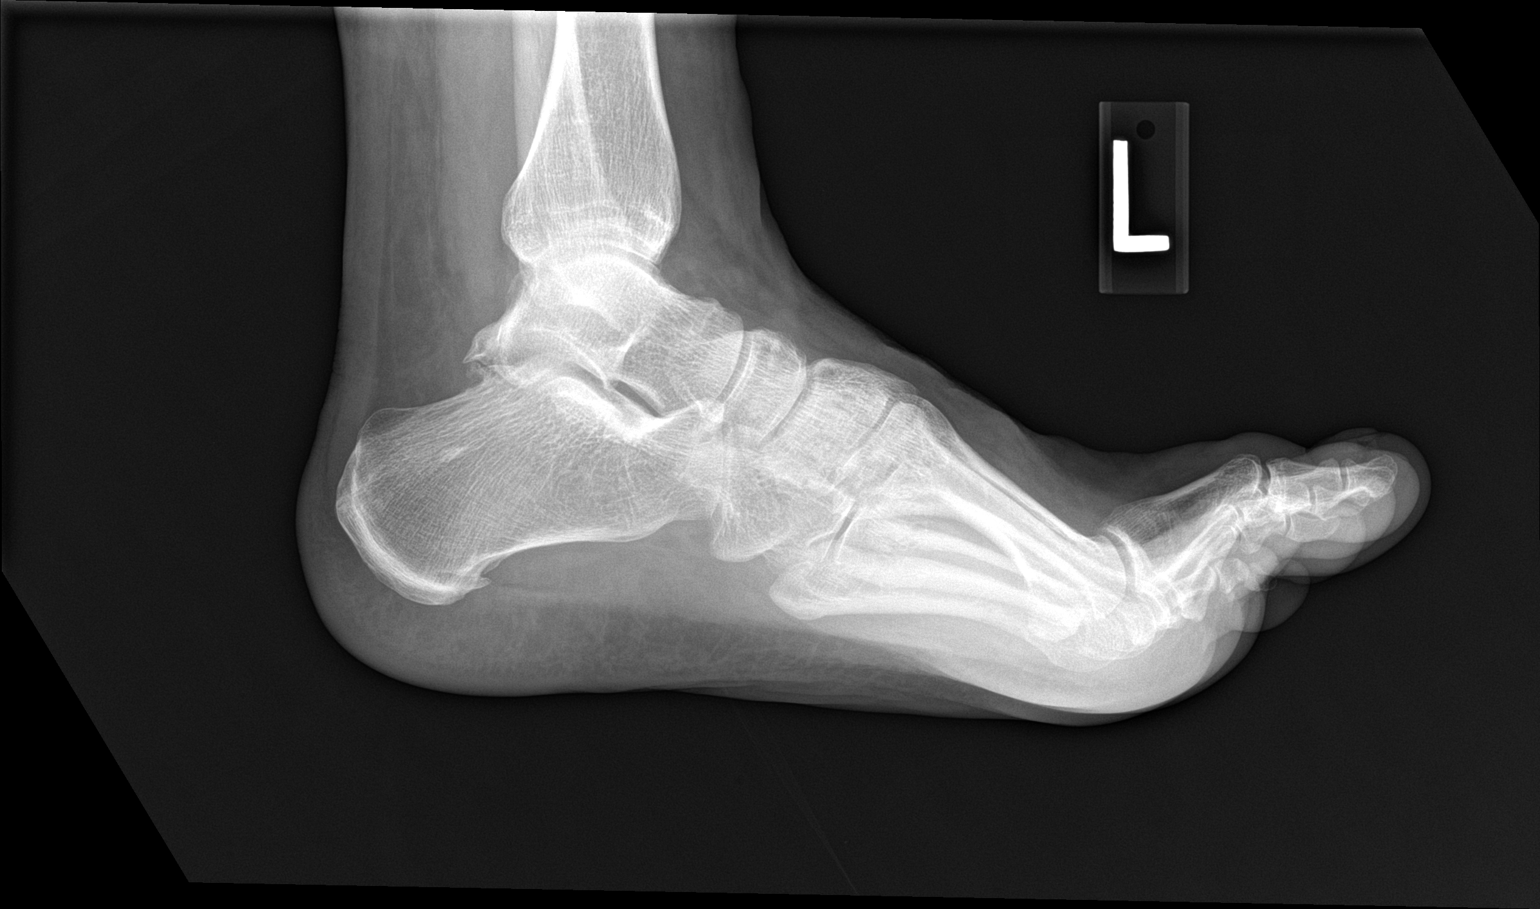

[3 of 3 positions shown; findings below may reference images not displayed]

FINDINGS: Right foot: There is no evidence of fracture or dislocation.
Cortical irregularity along the cuboid does not appear to be acute.
There is no evidence of arthropathy or other focal bone abnormality.
Subcutaneus soft tissue edema of the ankle.

Right ankle: No evidence of fracture, dislocation, or joint
effusion. No clear or medial clear space widening. No evidence of
severe arthropathy. No aggressive appearing focal bone abnormality.
Diffuse subcutaneus soft tissue edema that is worse medially
compared to laterally.
IMPRESSION: No acute displaced fracture or dislocation of the right foot/ankle.

ADDENDUM:
PLEASE NOTE: All findings and impression above should state LEFT
foot and ankle.

*** End of Addendum ***
FINDINGS: Right foot: There is no evidence of fracture or dislocation.
Cortical irregularity along the cuboid does not appear to be acute.
There is no evidence of arthropathy or other focal bone abnormality.
Subcutaneus soft tissue edema of the ankle.

Right ankle: No evidence of fracture, dislocation, or joint
effusion. No clear or medial clear space widening. No evidence of
severe arthropathy. No aggressive appearing focal bone abnormality.
Diffuse subcutaneus soft tissue edema that is worse medially
compared to laterally.
IMPRESSION: No acute displaced fracture or dislocation of the right foot/ankle.

## 2023-04-22 DIAGNOSIS — R509 Fever, unspecified: Secondary | ICD-10-CM | POA: Diagnosis not present

## 2023-04-22 DIAGNOSIS — J329 Chronic sinusitis, unspecified: Secondary | ICD-10-CM | POA: Diagnosis not present

## 2023-04-22 DIAGNOSIS — Z1152 Encounter for screening for COVID-19: Secondary | ICD-10-CM | POA: Diagnosis not present

## 2023-04-22 DIAGNOSIS — R0989 Other specified symptoms and signs involving the circulatory and respiratory systems: Secondary | ICD-10-CM | POA: Diagnosis not present

## 2023-06-17 ENCOUNTER — Ambulatory Visit: Payer: Self-pay

## 2023-06-17 NOTE — Telephone Encounter (Signed)
 Copied from CRM [id number]. Topic: Clinical - Red Word Triage >> Jun 17, 2023  4:26 PM Retta Caster wrote: Red Word that prompted transfer to Nurse Triage: Spot on back for 1-2 weeks/Pain when water hit it or taking a shower  Chief Complaint: "Spot" on back Symptoms: Pain when shower water hits it Frequency: 1-2 weeks Pertinent Negatives: Patient denies fever Disposition: [] ED /[] Urgent Care (no appt availability in office) / [x] Appointment(In office/virtual)/ []  [facility]/ [] Home Care/ [] Refused Recommended Disposition /[] [facility]/ []  Follow-up with PCP Additional Notes: Patient called in to describe a small "spot" on his back. Patient stated area looks like a scab, but could not describe it further. Patient stated he could not physically touch the area, but it hurts when water hits it. Patient denied fever. Advised patient to be seen within 3 days, per protocol. Scheduled with PCP tomorrow morning.   Reason for Disposition  Caller can't describe it clearly  Answer Assessment - Initial Assessment Questions 1. APPEARANCE of LESION: "What does it look like?"      "Looks like a scab" 2. SIZE: "How big is it?" (e.g., inches, cm; or compare to size of pinhead, tip of pen, eraser, coin, pea, grape, ping pong ball)      Smaller than a penny, size of a small mole 3. COLOR: "What color is it?" "Is there more than one color?"     Unknown- patient cannot describe 4. SHAPE: "What shape is it?" (e.g., round, irregular)     Unknown- patient cannot describe 5. RAISED: "Does it stick up above the skin or is it flat?" (e.g., raised or elevated)     States he doesn't think so 6. TENDER: "Does it hurt when you touch it?"  (Scale 1-10; or mild, moderate, severe)     States pain "shocks him" when water hits spot in shower, cannot physically touch it 7. LOCATION: "Where is it located?"      In between shoulder blades 8. ONSET: "When did it first appear?"      "Spot" became painful 1-2  weeks ago 9. NUMBER: "Is there just one?" or "Are there others?"     1 10. CAUSE: "What do you think it is?"       Unknown 11. OTHER SYMPTOMS: "Do you have any other symptoms?" (e.g., fever)       Denies fever, denies additional symptoms at this time  Protocols used: Skin Lesion - Moles or Growths-A-AH

## 2023-06-18 ENCOUNTER — Ambulatory Visit (INDEPENDENT_AMBULATORY_CARE_PROVIDER_SITE_OTHER): Admitting: Family Medicine

## 2023-06-18 ENCOUNTER — Encounter: Payer: Self-pay | Admitting: Family Medicine

## 2023-06-18 VITALS — BP 124/84 | HR 67 | Temp 98.4°F | Ht 71.0 in | Wt 213.0 lb

## 2023-06-18 DIAGNOSIS — E559 Vitamin D deficiency, unspecified: Secondary | ICD-10-CM | POA: Diagnosis not present

## 2023-06-18 DIAGNOSIS — Z13220 Encounter for screening for lipoid disorders: Secondary | ICD-10-CM | POA: Diagnosis not present

## 2023-06-18 DIAGNOSIS — R5383 Other fatigue: Secondary | ICD-10-CM

## 2023-06-18 DIAGNOSIS — Z0000 Encounter for general adult medical examination without abnormal findings: Secondary | ICD-10-CM

## 2023-06-18 DIAGNOSIS — D1801 Hemangioma of skin and subcutaneous tissue: Secondary | ICD-10-CM | POA: Diagnosis not present

## 2023-06-18 NOTE — Telephone Encounter (Signed)
 This request has been handled. No further action is required. Please review visit encounter for additional information.

## 2023-06-18 NOTE — Assessment & Plan Note (Signed)
 Irritated cherry angioma.  Discussed benign nature of area.  Should improve on it's own. He will let me know if having worsening symptoms.

## 2023-06-18 NOTE — Progress Notes (Signed)
 Travis Gay - 48 y.o. male MRN [id number]  Date of birth: 03/18/1975  Subjective Chief Complaint  Patient presents with   Medical Management of Chronic Issues    Skin concern, back , pt states  the spot on his back is painful to touch he reports he noticed it in the shower, unsure of how long its been there.     HPI Travis Gay is a 48 y.o. male here today with concern about spot on his back.  He is unsure how long he has had the area.  It is somewhat painful to touch, especially when hot water in the shower hits it.  He has not noted any drainage.  He denies fever or chills.   He would like to have labs completed for upcoming annual exam.   ROS:  A comprehensive ROS was completed and negative except as noted per HPI  No Known Allergies  Past Medical History:  Diagnosis Date   Heart murmur     Past Surgical History:  Procedure Laterality Date   FRACTURE SURGERY Left     Social History   Socioeconomic History   Marital status: Single    Spouse name: Not on file   Number of children: Not on file   Years of education: Not on file   Highest education level: Not on file  Occupational History   Not on file  Tobacco Use   Smoking status: Never   Smokeless tobacco: Never  Vaping Use   Vaping status: Never Used  Substance and Sexual Activity   Alcohol use: Yes    Alcohol/week: 2.0 standard drinks of alcohol    Types: 2 Standard drinks or equivalent per week   Drug use: Never   Sexual activity: Yes    Birth control/protection: Condom  Other Topics Concern   Not on file  Social History Narrative   Not on file   Social Drivers of Health   Financial Resource Strain: Low Risk  (03/02/2023)   Received from [facility]   Overall Financial Resource Strain (CARDIA)    Difficulty of Paying Living Expenses: Not hard at all  Food Insecurity: No Food Insecurity (03/02/2023)   Received from [facility]   Hunger Vital Sign    Worried About Running Out of Food in the  Last Year: Never true    Ran Out of Food in the Last Year: Never true  Transportation Needs: No Transportation Needs (03/02/2023)   Received from [facility] - Transportation    Lack of Transportation (Medical): No    Lack of Transportation (Non-Medical): No  Physical Activity: Sufficiently Active (06/30/2022)   Received from [facility], [facility]   Exercise Vital Sign    Days of Exercise per Week: 4 days    Minutes of Exercise per Session: 40 min  Stress: No Stress Concern Present (06/30/2022)   Received from [facility], [facility] - Occupational Stress Questionnaire    Feeling of Stress : Not at all  Social Connections: Moderately Integrated (06/30/2022)   Received from [facility], [facility]   Social Network    How would you rate your social network (family, work, friends)?: Adequate participation with social networks    Family History  Problem Relation Age of Onset   Deep vein thrombosis Mother    Heart disease Mother    Stroke Mother    Cancer Father     Health Maintenance  Topic Date  Due   HIV Screening  Never done   COVID-19 Vaccine (3 - Pfizer risk series) 10/19/2020   DTaP/Tdap/Td (2 - Td or Tdap) 05/21/2027   Colonoscopy  02/28/2031   Hepatitis C Screening  Completed   HPV VACCINES  Aged Out   Meningococcal B Vaccine  Aged Out   INFLUENZA VACCINE  Discontinued     ----------------------------------------------------------------------------------------------------------------------------------------------------------------------------------------------------------------- Physical Exam BP 124/84   Pulse 67   Temp 98.4 F (36.9 C) (Oral)   Ht 5\' 11"  (1.803 m)   Wt 213 lb (96.6 kg)   SpO2 99%   BMI 29.71 kg/m   Physical Exam Constitutional:      Appearance: Normal appearance.  Eyes:     General: No scleral icterus. Cardiovascular:     Pulses: Normal pulses.     Heart sounds:  Normal heart sounds.  Musculoskeletal:     Cervical back: Neck supple.  Skin:    Comments: Irritated cherry angioma on L side upper back  Neurological:     Mental Status: He is alert.  Psychiatric:        Mood and Affect: Mood normal.        Behavior: Behavior normal.     ------------------------------------------------------------------------------------------------------------------------------------------------------------------------------------------------------------------- Assessment and Plan  Cherry angioma Irritated cherry angioma.  Discussed benign nature of area.  Should improve on it's own. He will let me know if having worsening symptoms.    No orders of the defined types were placed in this encounter.   No follow-ups on file.

## 2023-06-18 NOTE — Patient Instructions (Signed)
Cherry Angioma A cherry angioma, also called a Ladean Raya spot, is a harmless growth on the skin. It is made up of blood vessels. Cherry angiomas can appear anywhere on the body, but they usually appear on the trunk and arms. What are the causes? The cause of this condition is not known, but it seems to be related to advancing age. What increases the risk? You are more likely to develop this condition if: You are over the age of 34. You have a family member with this condition. What are the signs or symptoms? Symptoms of this condition include harmless growths that are: Smooth, round, and red or purplish-red. As small as the tip of a pin or as big as a pencil eraser. How is this diagnosed? This condition is diagnosed with a skin exam. Rarely, a piece of the cherry angioma may be removed for testing if it is not clear that the growth is a cherry angioma. How is this treated? Treatment is not needed for this condition. If you do not like the way a cherry angioma looks, you may have it removed. Removal methods include: A method where heat is used to burn the cherry angioma off the skin (electrocautery). A method where the cherry angioma is frozen (cryosurgery). This causes it to eventually fall off the skin. A method where a laser is used to destroy the red blood cells and blood vessels in the angioma (laser therapy). A minor surgical procedure. A scalpel is used to remove the cherry angioma off the skin. A cherry angioma may come back after it has been removed. Follow these instructions at home: If you have a cherry angioma removed, keep the area clean and follow any other care instructions as told by your health care provider. Take over-the-counter and prescription medicines only as told by your health care provider. Keep all follow-up visits. This is important. Summary A cherry angioma is a harmless growth on the skin that is made up of blood vessels. Treatment is not needed for this  condition. If you do not like the way a cherry angioma looks, you may have it removed. If you have a cherry angioma removed, follow any care instructions as told by your health care provider. This information is not intended to replace advice given to you by your health care provider. Make sure you discuss any questions you have with your health care provider. Document Revised: 10/24/2020 Document Reviewed: 10/24/2020 Elsevier Patient Education  2024 [employer].

## 2023-06-19 LAB — CMP14+EGFR
ALT: 29 IU/L (ref 0–44)
AST: 39 IU/L (ref 0–40)
Albumin: 3.6 g/dL — ABNORMAL LOW (ref 4.1–5.1)
Alkaline Phosphatase: 84 IU/L (ref 44–121)
BUN/Creatinine Ratio: 11 (ref 9–20)
BUN: 11 mg/dL (ref 6–24)
Bilirubin Total: 1.8 mg/dL — ABNORMAL HIGH (ref 0.0–1.2)
CO2: 22 mmol/L (ref 20–29)
Calcium: 8.8 mg/dL (ref 8.7–10.2)
Chloride: 109 mmol/L — ABNORMAL HIGH (ref 96–106)
Creatinine, Ser: 1.04 mg/dL (ref 0.76–1.27)
Globulin, Total: 2 g/dL (ref 1.5–4.5)
Glucose: 82 mg/dL (ref 70–99)
Potassium: 3.8 mmol/L (ref 3.5–5.2)
Sodium: 143 mmol/L (ref 134–144)
Total Protein: 5.6 g/dL — ABNORMAL LOW (ref 6.0–8.5)
eGFR: 89 mL/min/{1.73_m2} (ref >59)

## 2023-06-19 LAB — CBC WITH DIFFERENTIAL/PLATELET
Basophils Absolute: 0 10*3/uL (ref 0.0–0.2)
Basos: 1 %
EOS (ABSOLUTE): 0.1 10*3/uL (ref 0.0–0.4)
Eos: 3 %
Hematocrit: 49.2 % (ref 37.5–51.0)
Hemoglobin: 16.7 g/dL (ref 13.0–17.7)
Immature Grans (Abs): 0 10*3/uL (ref 0.0–0.1)
Immature Granulocytes: 0 %
Lymphocytes Absolute: 2.3 10*3/uL (ref 0.7–3.1)
Lymphs: 61 %
MCH: 34.2 pg — ABNORMAL HIGH (ref 26.6–33.0)
MCHC: 33.9 g/dL (ref 31.5–35.7)
MCV: 101 fL — ABNORMAL HIGH (ref 79–97)
Monocytes Absolute: 0.6 10*3/uL (ref 0.1–0.9)
Monocytes: 15 %
Neutrophils Absolute: 0.7 10*3/uL — ABNORMAL LOW (ref 1.4–7.0)
Neutrophils: 20 %
Platelets: 130 10*3/uL — ABNORMAL LOW (ref 150–450)
RBC: 4.88 x10E6/uL (ref 4.14–5.80)
RDW: 14.3 % (ref 11.6–15.4)
WBC: 3.7 10*3/uL (ref 3.4–10.8)

## 2023-06-19 LAB — LIPID PANEL WITH LDL/HDL RATIO
Cholesterol, Total: 200 mg/dL — ABNORMAL HIGH (ref 100–199)
HDL: 58 mg/dL (ref >39)
LDL Chol Calc (NIH): 123 mg/dL — ABNORMAL HIGH (ref 0–99)
LDL/HDL Ratio: 2.1 ratio (ref 0.0–3.6)
Triglycerides: 108 mg/dL (ref 0–149)
VLDL Cholesterol Cal: 19 mg/dL (ref 5–40)

## 2023-06-19 LAB — VITAMIN D 25 HYDROXY (VIT D DEFICIENCY, FRACTURES): Vit D, 25-Hydroxy: 15.9 ng/mL — ABNORMAL LOW (ref 30.0–100.0)

## 2023-06-19 LAB — TSH: TSH: 2.02 u[IU]/mL (ref 0.450–4.500)

## 2023-06-23 NOTE — Telephone Encounter (Unsigned)
 Copied from CRM [id number]. Topic: Clinical - Lab/Test Results >> Jun 23, 2023 12:40 PM Blair Bumpers wrote: Reason for CRM: Patient called to speak with nurse about blood test results. He is requesting a call back after 3:30PM today. CB #: [phone]

## 2023-06-24 ENCOUNTER — Other Ambulatory Visit: Payer: Self-pay | Admitting: Family Medicine

## 2023-06-24 ENCOUNTER — Encounter: Payer: Self-pay | Admitting: Family Medicine

## 2023-06-24 DIAGNOSIS — R718 Other abnormality of red blood cells: Secondary | ICD-10-CM

## 2023-06-24 MED ORDER — VITAMIN D (ERGOCALCIFEROL) 1.25 MG (50000 UNIT) PO CAPS: 50000.0000 [IU] | ORAL_CAPSULE | ORAL | 1 refills | Status: AC

## 2023-07-01 DIAGNOSIS — J383 Other diseases of vocal cords: Secondary | ICD-10-CM | POA: Diagnosis not present

## 2023-07-01 DIAGNOSIS — J3801 Paralysis of vocal cords and larynx, unilateral: Secondary | ICD-10-CM | POA: Diagnosis not present

## 2023-07-06 DIAGNOSIS — R112 Nausea with vomiting, unspecified: Secondary | ICD-10-CM | POA: Diagnosis not present

## 2023-07-06 DIAGNOSIS — R197 Diarrhea, unspecified: Secondary | ICD-10-CM | POA: Diagnosis not present

## 2023-08-23 ENCOUNTER — Other Ambulatory Visit (HOSPITAL_COMMUNITY)

## 2023-10-19 ENCOUNTER — Encounter: Payer: Self-pay | Admitting: Sports Medicine

## 2023-12-06 DIAGNOSIS — M79662 Pain in left lower leg: Secondary | ICD-10-CM | POA: Diagnosis not present

## 2023-12-16 DIAGNOSIS — Z20822 Contact with and (suspected) exposure to covid-19: Secondary | ICD-10-CM | POA: Diagnosis not present

## 2023-12-16 DIAGNOSIS — J111 Influenza due to unidentified influenza virus with other respiratory manifestations: Secondary | ICD-10-CM | POA: Diagnosis not present

## 2023-12-19 ENCOUNTER — Other Ambulatory Visit: Payer: Self-pay | Admitting: Medical Genetics

## 2023-12-19 DIAGNOSIS — Z006 Encounter for examination for normal comparison and control in clinical research program: Secondary | ICD-10-CM

## 2024-02-15 DIAGNOSIS — H04222 Epiphora due to insufficient drainage, left lacrimal gland: Secondary | ICD-10-CM | POA: Diagnosis not present
